# Patient Record
Sex: Male | Born: 1955 | Race: White | Hispanic: No | Marital: Married | Smoking: Former smoker
Health system: Southern US, Community
[De-identification: ages and names within clinical notes are randomized; demographics above are authoritative.]

## PROBLEM LIST (undated history)

## (undated) DIAGNOSIS — Z789 Other specified health status: Secondary | ICD-10-CM

## (undated) HISTORY — PX: COLONOSCOPY: SHX174

---

## 1990-07-05 HISTORY — PX: INGUINAL HERNIA REPAIR: SHX194

## 1999-02-21 ENCOUNTER — Emergency Department (HOSPITAL_COMMUNITY): Admission: EM | Admit: 1999-02-21 | Discharge: 1999-02-21 | Payer: Self-pay | Admitting: Emergency Medicine

## 2000-07-16 ENCOUNTER — Emergency Department (HOSPITAL_COMMUNITY): Admission: EM | Admit: 2000-07-16 | Discharge: 2000-07-16 | Payer: Self-pay | Admitting: Emergency Medicine

## 2009-09-28 ENCOUNTER — Emergency Department (HOSPITAL_COMMUNITY): Admission: EM | Admit: 2009-09-28 | Discharge: 2009-09-28 | Payer: Self-pay | Admitting: Emergency Medicine

## 2010-06-01 ENCOUNTER — Encounter: Admission: RE | Admit: 2010-06-01 | Discharge: 2010-06-01 | Payer: Self-pay | Admitting: Internal Medicine

## 2010-09-28 LAB — DIFFERENTIAL
Basophils Absolute: 0.1 10*3/uL (ref 0.0–0.1)
Basophils Relative: 1 % (ref 0–1)
Lymphocytes Relative: 16 % (ref 12–46)
Monocytes Absolute: 0.5 10*3/uL (ref 0.1–1.0)
Neutro Abs: 5.1 10*3/uL (ref 1.7–7.7)
Neutrophils Relative %: 73 % (ref 43–77)

## 2010-09-28 LAB — BASIC METABOLIC PANEL
CO2: 27 mEq/L (ref 19–32)
Calcium: 8.7 mg/dL (ref 8.4–10.5)
Creatinine, Ser: 1.08 mg/dL (ref 0.4–1.5)
GFR calc non Af Amer: >60 mL/min (ref >60)
Glucose, Bld: 111 mg/dL — ABNORMAL HIGH (ref 70–99)
Sodium: 134 mEq/L — ABNORMAL LOW (ref 135–145)

## 2010-09-28 LAB — CBC
Hemoglobin: 13.1 g/dL (ref 13.0–17.0)
MCHC: 33.4 g/dL (ref 30.0–36.0)
Platelets: 202 10*3/uL (ref 150–400)
RDW: 12.2 % (ref 11.5–15.5)

## 2010-09-28 LAB — URINALYSIS, ROUTINE W REFLEX MICROSCOPIC
Glucose, UA: NEGATIVE mg/dL
Ketones, ur: NEGATIVE mg/dL
Leukocytes, UA: NEGATIVE
Nitrite: NEGATIVE
Protein, ur: NEGATIVE mg/dL
pH: 6.5 (ref 5.0–8.0)

## 2010-09-28 LAB — URINE MICROSCOPIC-ADD ON

## 2013-07-25 ENCOUNTER — Other Ambulatory Visit: Payer: Self-pay | Admitting: Orthopedic Surgery

## 2013-07-25 DIAGNOSIS — M25562 Pain in left knee: Secondary | ICD-10-CM

## 2013-07-25 DIAGNOSIS — R609 Edema, unspecified: Secondary | ICD-10-CM

## 2013-07-29 ENCOUNTER — Ambulatory Visit
Admission: RE | Admit: 2013-07-29 | Discharge: 2013-07-29 | Disposition: A | Discharge status: Home / Self Care | Payer: 59 | Source: Ambulatory Visit | Attending: Orthopedic Surgery | Admitting: Orthopedic Surgery

## 2013-07-29 DIAGNOSIS — M25562 Pain in left knee: Secondary | ICD-10-CM

## 2013-07-29 DIAGNOSIS — R609 Edema, unspecified: Secondary | ICD-10-CM

## 2013-08-01 ENCOUNTER — Other Ambulatory Visit: Payer: Self-pay | Admitting: Orthopedic Surgery

## 2013-08-07 ENCOUNTER — Encounter (HOSPITAL_BASED_OUTPATIENT_CLINIC_OR_DEPARTMENT_OTHER): Payer: Self-pay | Admitting: *Deleted

## 2013-08-07 NOTE — Progress Notes (Signed)
Pt has no cardiac or resp problems-healthy  

## 2013-08-08 ENCOUNTER — Ambulatory Visit (HOSPITAL_BASED_OUTPATIENT_CLINIC_OR_DEPARTMENT_OTHER): Payer: 59 | Admitting: Anesthesiology

## 2013-08-08 ENCOUNTER — Encounter (HOSPITAL_BASED_OUTPATIENT_CLINIC_OR_DEPARTMENT_OTHER)
Admission: RE | Disposition: A | Discharge status: Home / Self Care | Payer: Self-pay | Source: Ambulatory Visit | Attending: Orthopedic Surgery

## 2013-08-08 ENCOUNTER — Encounter (HOSPITAL_BASED_OUTPATIENT_CLINIC_OR_DEPARTMENT_OTHER): Payer: 59 | Admitting: Anesthesiology

## 2013-08-08 ENCOUNTER — Ambulatory Visit (HOSPITAL_BASED_OUTPATIENT_CLINIC_OR_DEPARTMENT_OTHER)
Admission: RE | Admit: 2013-08-08 | Discharge: 2013-08-08 | Disposition: A | Discharge status: Home / Self Care | Payer: 59 | Source: Ambulatory Visit | Attending: Orthopedic Surgery | Admitting: Orthopedic Surgery

## 2013-08-08 ENCOUNTER — Encounter (HOSPITAL_BASED_OUTPATIENT_CLINIC_OR_DEPARTMENT_OTHER): Payer: Self-pay | Admitting: *Deleted

## 2013-08-08 DIAGNOSIS — K219 Gastro-esophageal reflux disease without esophagitis: Secondary | ICD-10-CM | POA: Insufficient documentation

## 2013-08-08 DIAGNOSIS — M23305 Other meniscus derangements, unspecified medial meniscus, unspecified knee: Secondary | ICD-10-CM | POA: Insufficient documentation

## 2013-08-08 DIAGNOSIS — M6750 Plica syndrome, unspecified knee: Secondary | ICD-10-CM | POA: Insufficient documentation

## 2013-08-08 DIAGNOSIS — Z87891 Personal history of nicotine dependence: Secondary | ICD-10-CM | POA: Insufficient documentation

## 2013-08-08 HISTORY — DX: Other specified health status: Z78.9

## 2013-08-08 HISTORY — PX: KNEE ARTHROSCOPY: SHX127

## 2013-08-08 SURGERY — ARTHROSCOPY, KNEE
Anesthesia: General | Site: Knee | Laterality: Left

## 2013-08-08 MED ORDER — DEXAMETHASONE SODIUM PHOSPHATE 4 MG/ML IJ SOLN
INTRAMUSCULAR | Status: DC | PRN
  Administered 2013-08-08: 10 mg via INTRAVENOUS

## 2013-08-08 MED ORDER — MIDAZOLAM HCL 2 MG/2ML IJ SOLN
1.0000 mg | INTRAMUSCULAR | Status: DC | PRN
  Administered 2013-08-08: 2 mg via INTRAVENOUS

## 2013-08-08 MED ORDER — LACTATED RINGERS IV SOLN
INTRAVENOUS | Status: DC
  Administered 2013-08-08: 13:00:00 via INTRAVENOUS

## 2013-08-08 MED ORDER — BUPIVACAINE-EPINEPHRINE PF 0.5-1:200000 % IJ SOLN
INTRAMUSCULAR | Status: AC
  Filled 2013-08-08: qty 30

## 2013-08-08 MED ORDER — FENTANYL CITRATE 0.05 MG/ML IJ SOLN
INTRAMUSCULAR | Status: DC | PRN
  Administered 2013-08-08: 100 ug via INTRAVENOUS

## 2013-08-08 MED ORDER — BUPIVACAINE-EPINEPHRINE PF 0.5-1:200000 % IJ SOLN: INTRAMUSCULAR | Status: DC | PRN

## 2013-08-08 MED ORDER — ONDANSETRON HCL 4 MG/2ML IJ SOLN
INTRAMUSCULAR | Status: DC | PRN
  Administered 2013-08-08: 4 mg via INTRAVENOUS

## 2013-08-08 MED ORDER — CHLORHEXIDINE GLUCONATE 4 % EX LIQD: 60.0000 mL | Freq: Once | CUTANEOUS | Status: DC

## 2013-08-08 MED ORDER — FENTANYL CITRATE 0.05 MG/ML IJ SOLN
50.0000 ug | INTRAMUSCULAR | Status: DC | PRN
  Administered 2013-08-08: 100 ug via INTRAVENOUS

## 2013-08-08 MED ORDER — FENTANYL CITRATE 0.05 MG/ML IJ SOLN
INTRAMUSCULAR | Status: AC
  Filled 2013-08-08: qty 2

## 2013-08-08 MED ORDER — SODIUM CHLORIDE 0.9 % IR SOLN
Status: DC | PRN
  Administered 2013-08-08: 6000 mL

## 2013-08-08 MED ORDER — MIDAZOLAM HCL 2 MG/2ML IJ SOLN
INTRAMUSCULAR | Status: AC
  Filled 2013-08-08: qty 2

## 2013-08-08 MED ORDER — PROPOFOL 10 MG/ML IV BOLUS
INTRAVENOUS | Status: AC
  Filled 2013-08-08: qty 20

## 2013-08-08 MED ORDER — BUPIVACAINE-EPINEPHRINE PF 0.5-1:200000 % IJ SOLN
INTRAMUSCULAR | Status: DC | PRN
  Administered 2013-08-08: 25 mL

## 2013-08-08 MED ORDER — ROPIVACAINE HCL 5 MG/ML IJ SOLN
INTRAMUSCULAR | Status: DC | PRN
  Administered 2013-08-08: 20 mL

## 2013-08-08 MED ORDER — FENTANYL CITRATE 0.05 MG/ML IJ SOLN
INTRAMUSCULAR | Status: AC
  Filled 2013-08-08: qty 4

## 2013-08-08 MED ORDER — PROPOFOL 10 MG/ML IV BOLUS
INTRAVENOUS | Status: DC | PRN
  Administered 2013-08-08: 200 mg via INTRAVENOUS

## 2013-08-08 MED ORDER — CEFAZOLIN SODIUM-DEXTROSE 2-3 GM-% IV SOLR
INTRAVENOUS | Status: AC
  Filled 2013-08-08: qty 50

## 2013-08-08 MED ORDER — LIDOCAINE HCL (CARDIAC) 20 MG/ML IV SOLN
INTRAVENOUS | Status: DC | PRN
  Administered 2013-08-08: 50 mg via INTRAVENOUS

## 2013-08-08 SURGICAL SUPPLY — 46 items
BANDAGE ELASTIC 6 VELCRO ST LF (GAUZE/BANDAGES/DRESSINGS) ×3 IMPLANT
BANDAGE ESMARK 6X9 LF (GAUZE/BANDAGES/DRESSINGS) IMPLANT
BLADE CUDA 5.5 (BLADE) IMPLANT
BLADE CUDA GRT WHITE 3.5 (BLADE) ×3 IMPLANT
BLADE CUDA SHAVER 3.5 (BLADE) IMPLANT
BLADE CUTTER GATOR 3.5 (BLADE) IMPLANT
BLADE MINI RND TIP GREEN BEAV (BLADE) IMPLANT
BNDG ESMARK 6X9 LF (GAUZE/BANDAGES/DRESSINGS)
BUR OVAL 6.0 (BURR) IMPLANT
CANISTER SUCT 3000ML (MISCELLANEOUS) IMPLANT
DRAPE ARTHROSCOPY W/POUCH 114 (DRAPES) ×3 IMPLANT
DRAPE U-SHAPE 47X51 STRL (DRAPES) ×3 IMPLANT
DURAPREP 26ML APPLICATOR (WOUND CARE) ×3 IMPLANT
ELECT MENISCUS 165MM 90D (ELECTRODE) IMPLANT
ELECT REM PT RETURN 9FT ADLT (ELECTROSURGICAL)
ELECTRODE REM PT RTRN 9FT ADLT (ELECTROSURGICAL) IMPLANT
GAUZE XEROFORM 1X8 LF (GAUZE/BANDAGES/DRESSINGS) ×3 IMPLANT
GLOVE BIO SURGEON STRL SZ 6.5 (GLOVE) ×4 IMPLANT
GLOVE BIO SURGEON STRL SZ7.5 (GLOVE) ×3 IMPLANT
GLOVE BIO SURGEONS STRL SZ 6.5 (GLOVE) ×2
GLOVE BIOGEL PI IND STRL 7.0 (GLOVE) ×2 IMPLANT
GLOVE BIOGEL PI IND STRL 8 (GLOVE) ×1 IMPLANT
GLOVE BIOGEL PI IND STRL 8.5 (GLOVE) ×2 IMPLANT
GLOVE BIOGEL PI INDICATOR 7.0 (GLOVE) ×4
GLOVE BIOGEL PI INDICATOR 8 (GLOVE) ×2
GLOVE BIOGEL PI INDICATOR 8.5 (GLOVE) ×4
GLOVE SURG ORTHO 8.0 STRL STRW (GLOVE) ×3 IMPLANT
GOWN STRL REUS W/ TWL LRG LVL3 (GOWN DISPOSABLE) ×3 IMPLANT
GOWN STRL REUS W/ TWL XL LVL3 (GOWN DISPOSABLE) IMPLANT
GOWN STRL REUS W/TWL LRG LVL3 (GOWN DISPOSABLE) ×6
GOWN STRL REUS W/TWL XL LVL3 (GOWN DISPOSABLE)
KNEE WRAP E Z 3 GEL PACK (MISCELLANEOUS) ×3 IMPLANT
MANIFOLD NEPTUNE II (INSTRUMENTS) IMPLANT
PACK ARTHROSCOPY DSU (CUSTOM PROCEDURE TRAY) ×3 IMPLANT
PACK BASIN DAY SURGERY FS (CUSTOM PROCEDURE TRAY) ×3 IMPLANT
PADDING CAST COTTON 6X4 STRL (CAST SUPPLIES) ×3 IMPLANT
PENCIL BUTTON HOLSTER BLD 10FT (ELECTRODE) IMPLANT
SET ARTHROSCOPY TUBING (MISCELLANEOUS) ×2
SET ARTHROSCOPY TUBING LN (MISCELLANEOUS) ×1 IMPLANT
SPONGE GAUZE 4X4 12PLY (GAUZE/BANDAGES/DRESSINGS) ×3 IMPLANT
SUT ETHILON 4 0 PS 2 18 (SUTURE) ×3 IMPLANT
TOWEL OR 17X24 6PK STRL BLUE (TOWEL DISPOSABLE) ×3 IMPLANT
WAND 3.0 CAPSURE 30 DEG W/CORD (SURGICAL WAND) IMPLANT
WAND 30 DEG SABER W/CORD (SURGICAL WAND) IMPLANT
WAND STAR VAC 90 (SURGICAL WAND) IMPLANT
WATER STERILE IRR 1000ML POUR (IV SOLUTION) ×3 IMPLANT

## 2013-08-08 NOTE — Anesthesia Preprocedure Evaluation (Addendum)
Anesthesia Evaluation  Patient identified by MRN, date of birth, ID band Patient awake    Reviewed: Allergy & Precautions, H&P , NPO status , Patient's Chart, lab work & pertinent test results  Airway Mallampati: II TM Distance: >3 FB Neck ROM: Full    Dental no notable dental hx. (+) Teeth Intact and Dental Advisory Given   Pulmonary neg pulmonary ROS, former smoker,  breath sounds clear to auscultation  Pulmonary exam normal       Cardiovascular negative cardio ROS  Rhythm:Regular Rate:Normal     Neuro/Psych negative neurological ROS  negative psych ROS   GI/Hepatic Neg liver ROS, GERD-  Medicated and Controlled,  Endo/Other  negative endocrine ROS  Renal/GU negative Renal ROS  negative genitourinary   Musculoskeletal   Abdominal   Peds  Hematology negative hematology ROS (+)   Anesthesia Other Findings   Reproductive/Obstetrics negative OB ROS                          Anesthesia Physical Anesthesia Plan  ASA: II  Anesthesia Plan: General   Post-op Pain Management:    Induction: Intravenous  Airway Management Planned: LMA  Additional Equipment:   Intra-op Plan:   Post-operative Plan: Extubation in OR  Informed Consent: I have reviewed the patients History and Physical, chart, labs and discussed the procedure including the risks, benefits and alternatives for the proposed anesthesia with the patient or authorized representative who has indicated his/her understanding and acceptance.   Dental advisory given  Plan Discussed with: CRNA and Surgeon  Anesthesia Plan Comments:        Anesthesia Quick Evaluation

## 2013-08-08 NOTE — Progress Notes (Signed)
Assisted Dr. Fitzgerald with left, knee block. Side rails up, monitors on throughout procedure. See vital signs in flow sheet. Tolerated Procedure well. 

## 2013-08-08 NOTE — Transfer of Care (Signed)
Immediate Anesthesia Transfer of Care Note  Patient: Alan Cortez  Procedure(s) Performed: Procedure(s): ARTHROSCOPY KNEE PARTIAL MEDIAL MENISECTOMY, AND PLICA  RESECTION (Left)  Patient Location: PACU  Anesthesia Type:General and Regional  Level of Consciousness: sedated  Airway & Oxygen Therapy: Patient Spontanous Breathing and Patient connected to face mask oxygen  Post-op Assessment: Report given to PACU RN and Post -op Vital signs reviewed and stable  Post vital signs: Reviewed and stable  Complications: No apparent anesthesia complications

## 2013-08-08 NOTE — Anesthesia Procedure Notes (Signed)
Procedure Name: LMA Insertion Date/Time: 08/08/2013 1:31 PM Performed by: Caren MacadamARTER, Kalley Nicholl W Pre-anesthesia Checklist: Patient identified, Emergency Drugs available, Suction available and Patient being monitored Patient Re-evaluated:Patient Re-evaluated prior to inductionOxygen Delivery Method: Circle System Utilized Preoxygenation: Pre-oxygenation with 100% oxygen Intubation Type: IV induction Ventilation: Mask ventilation without difficulty LMA: LMA inserted LMA Size: 5.0 Number of attempts: 1 Airway Equipment and Method: bite block Placement Confirmation: positive ETCO2 and breath sounds checked- equal and bilateral Tube secured with: Tape Dental Injury: Teeth and Oropharynx as per pre-operative assessment

## 2013-08-08 NOTE — Anesthesia Postprocedure Evaluation (Signed)
  Anesthesia Post-op Note  Patient: Alan CurioJeffrey W Cortez  Procedure(s) Performed: Procedure(s): ARTHROSCOPY KNEE PARTIAL MEDIAL MENISECTOMY, AND PLICA  RESECTION (Left)  Patient Location: PACU  Anesthesia Type:GA combined with regional for post-op pain  Level of Consciousness: awake and alert   Airway and Oxygen Therapy: Patient Spontanous Breathing  Post-op Pain: mild  Post-op Assessment: Post-op Vital signs reviewed  Post-op Vital Signs: stable  Complications: No apparent anesthesia complications

## 2013-08-08 NOTE — H&P (View-Only) (Signed)
Pt has no cardiac or resp problems-healthy

## 2013-08-08 NOTE — Discharge Instructions (Signed)
°  Post Anesthesia Home Care Instructions ° °Activity: °Get plenty of rest for the remainder of the day. A responsible adult should stay with you for 24 hours following the procedure.  °For the next 24 hours, DO NOT: °-Drive a car °-Operate machinery °-Drink alcoholic beverages °-Take any medication unless instructed by your physician °-Make any legal decisions or sign important papers. ° °Meals: °Start with liquid foods such as gelatin or soup. Progress to regular foods as tolerated. Avoid greasy, spicy, heavy foods. If nausea and/or vomiting occur, drink only clear liquids until the nausea and/or vomiting subsides. Call your physician if vomiting continues. ° °Special Instructions/Symptoms: °Your throat may feel dry or sore from the anesthesia or the breathing tube placed in your throat during surgery. If this causes discomfort, gargle with warm salt water. The discomfort should disappear within 24 hours. ° °Regional Anesthesia Blocks ° °1. Numbness or the inability to move the "blocked" extremity may last from 3-48 hours after placement. The length of time depends on the medication injected and your individual response to the medication. If the numbness is not going away after 48 hours, call your surgeon. ° °2. The extremity that is blocked will need to be protected until the numbness is gone and the  Strength has returned. Because you cannot feel it, you will need to take extra care to avoid injury. Because it may be weak, you may have difficulty moving it or using it. You may not know what position it is in without looking at it while the block is in effect. ° °3. For blocks in the legs and feet, returning to weight bearing and walking needs to be done carefully. You will need to wait until the numbness is entirely gone and the strength has returned. You should be able to move your leg and foot normally before you try and bear weight or walk. You will need someone to be with you when you first try to ensure you  do not fall and possibly risk injury. ° °4. Bruising and tenderness at the needle site are common side effects and will resolve in a few days. ° °5. Persistent numbness or new problems with movement should be communicated to the surgeon or the [facility] ([phone])/ [facility] ([phone]). °

## 2013-08-08 NOTE — Interval H&P Note (Signed)
History and Physical Interval Note:  08/08/2013 1:32 PM  Alan CurioJeffrey W Gorelik  has presented today for surgery, with the diagnosis of medial meniscus tear left knee  The various methods of treatment have been discussed with the patient and family. After consideration of risks, benefits and other options for treatment, the patient has consented to  Procedure(s): ARTHROSCOPY KNEE (Left) as a surgical intervention .  The patient's history has been reviewed, patient examined, no change in status, stable for surgery.  I have reviewed the patient's chart and labs.  Questions were answered to the patient's satisfaction.     Joscelin Fray,STEPHEN D

## 2013-08-08 NOTE — H&P (Signed)
  Alan CurioJeffrey W Cortez MRN:  [id number] DOB/SEX:  December 30, 1955/male  CHIEF COMPLAINT:  Painful left Knee  HISTORY: Patient is a 58 y.o. male presented with a history of pain in the left knee. Onset of symptoms was abrupt starting several weeks ago with gradually worsening course since that time. Prior procedures on the knee include none. Patient has been treated conservatively with over-the-counter NSAIDs and activity modification. Patient currently rates pain in the knee at 7 out of 10 with activity. There is no pain at night.  PAST MEDICAL HISTORY: There are no active problems to display for this patient.  Past Medical History  Diagnosis Date  . Medical history non-contributory    Past Surgical History  Procedure Laterality Date  . Inguinal hernia repair  1992    left  . Colonoscopy       MEDICATIONS:   No prescriptions prior to admission    ALLERGIES:  No Known Allergies  REVIEW OF SYSTEMS:  Pertinent items are noted in HPI.   FAMILY HISTORY:  History reviewed. No pertinent family history.  SOCIAL HISTORY:   History  Substance Use Topics  . Smoking status: Former Smoker    Quit date: 08/07/1978  . Smokeless tobacco: Not on file  . Alcohol Use: Yes     Comment: social     EXAMINATION:  Vital signs in last 24 hours: Weight:  [83.915 kg (185 lb)] 83.915 kg (185 lb) (02/03 0957)  General appearance: alert, cooperative and no distress Lungs: clear to auscultation bilaterally Heart: regular rate and rhythm, S1, S2 normal, no murmur, click, rub or gallop Abdomen: soft, non-tender; bowel sounds normal; no masses,  no organomegaly Extremities: extremities normal, atraumatic, no cyanosis or edema and Homans sign is negative, no sign of DVT Pulses: 2+ and symmetric Skin: Skin color, texture, turgor normal. No rashes or lesions Neurologic: Alert and oriented X 3, normal strength and tone. Normal symmetric reflexes. 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 The overall alignment is neutral. The bone quality appears to be good for age and reported activity level.  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 The patient has failed conservative treatment.  The clearance notes were reviewed.  After discussion with the patient it was felt that knee debridement was indicated. The procedure,  risks, and benefits of total knee arthroplasty were presented and reviewed.  The patient acknowledged the explanation, agreed to proceed with the plan.  Alan Cortez 08/08/2013, 7:39 AM

## 2013-08-09 ENCOUNTER — Encounter (HOSPITAL_BASED_OUTPATIENT_CLINIC_OR_DEPARTMENT_OTHER): Payer: Self-pay | Admitting: Orthopedic Surgery

## 2013-08-09 NOTE — Op Note (Signed)
Dictation Number: [id number]

## 2013-08-10 NOTE — Op Note (Signed)
NAMDelorse Lek:  Ohanian, JEFFREY               ACCOUNT NO.:  [id number]  MEDICAL RECORD NO.:  [id number]  LOCATION:                               FACILITY:  [facility]  PHYSICIAN:  Mila HomerStephen D. Sherlean FootLucey, M.D.      DATE OF BIRTH:  DATE OF PROCEDURE:  08/08/2013 DATE OF DISCHARGE:  08/08/2013                              OPERATIVE REPORT   SURGEON:  Mila HomerStephen D. Sherlean FootLucey, M.D.  ASSISTANT:  None.  ANESTHESIA:  General.  PREOPERATIVE DIAGNOSIS:  Left knee medial meniscus tear.  PREOPERATIVE DIAGNOSES:  Left knee medial meniscus tear.  Plica Syndrome.  PROCEDURES:  Left knee arthroscopy, partial medial meniscectomy, and plica resection.  DESCRIPTION OF PROCEDURE:  The patient was laid supine, administered general anesthesia.  Left leg was prepped and draped in usual sterile fashion.  Inferolateral and inferomedial portals were created with an #11 blade, blunt trocar, and cannula.  Diagnostic arthroscopy revealed minimal chondromalacia in the knee at all.  There was a large medial plica, which was resected.  I went into the medial compartment and very complex tear of the medial meniscus.  Straight upbiting basket forceps and shaver were used to perform partial medial meniscectomy.  Once that was done, I went into the notch.  ACL and PCL were normal.  I went into the lateral compartment, normal as well.  I then lavaged and closed with 4-0 nylon suture.  Dressed with Xeroform, dressing sponges, sterile Webril, and Ace wrap.  COMPLICATIONS:  None.  DRAINS:  None.          ______________________________ Mila HomerStephen D. Sherlean FootLucey, M.D.     SDL/MEDQ  D:  08/09/2013  T:  08/10/2013  Job:  [id number]

## 2015-10-28 ENCOUNTER — Ambulatory Visit: Payer: Self-pay | Admitting: General Surgery

## 2015-11-03 ENCOUNTER — Ambulatory Visit: Payer: Commercial Managed Care - HMO

## 2015-11-03 ENCOUNTER — Ambulatory Visit
Admission: RE | Admit: 2015-11-03 | Discharge: 2015-11-03 | Disposition: A | Discharge status: Home / Self Care | Payer: Commercial Managed Care - HMO | Source: Ambulatory Visit | Attending: General Surgery | Admitting: General Surgery

## 2015-11-03 ENCOUNTER — Other Ambulatory Visit: Payer: Self-pay | Admitting: General Surgery

## 2015-11-03 ENCOUNTER — Encounter (HOSPITAL_BASED_OUTPATIENT_CLINIC_OR_DEPARTMENT_OTHER)
Admission: RE | Admit: 2015-11-03 | Discharge: 2015-11-03 | Disposition: A | Discharge status: Home / Self Care | Payer: Self-pay | Source: Ambulatory Visit | Attending: General Surgery | Admitting: General Surgery

## 2015-11-03 ENCOUNTER — Encounter (HOSPITAL_BASED_OUTPATIENT_CLINIC_OR_DEPARTMENT_OTHER): Payer: Self-pay | Admitting: *Deleted

## 2015-11-03 ENCOUNTER — Other Ambulatory Visit: Payer: Self-pay

## 2015-11-03 DIAGNOSIS — K4090 Unilateral inguinal hernia, without obstruction or gangrene, not specified as recurrent: Secondary | ICD-10-CM | POA: Diagnosis not present

## 2015-11-03 DIAGNOSIS — Z01818 Encounter for other preprocedural examination: Secondary | ICD-10-CM

## 2015-11-03 DIAGNOSIS — Z87891 Personal history of nicotine dependence: Secondary | ICD-10-CM | POA: Diagnosis not present

## 2015-11-03 LAB — CBC WITH DIFFERENTIAL/PLATELET
BASOS ABS: 0 10*3/uL (ref 0.0–0.1)
Basophils Relative: 0 %
EOS PCT: 2 %
Eosinophils Absolute: 0.1 10*3/uL (ref 0.0–0.7)
HEMATOCRIT: 40.5 % (ref 39.0–52.0)
Hemoglobin: 12.9 g/dL — ABNORMAL LOW (ref 13.0–17.0)
LYMPHS ABS: 1.6 10*3/uL (ref 0.7–4.0)
LYMPHS PCT: 28 %
MCH: 27 pg (ref 26.0–34.0)
MCHC: 31.9 g/dL (ref 30.0–36.0)
MCV: 84.9 fL (ref 78.0–100.0)
MONO ABS: 0.5 10*3/uL (ref 0.1–1.0)
MONOS PCT: 9 %
NEUTROS ABS: 3.4 10*3/uL (ref 1.7–7.7)
Neutrophils Relative %: 61 %
Platelets: 225 10*3/uL (ref 150–400)
RBC: 4.77 MIL/uL (ref 4.22–5.81)
RDW: 13 % (ref 11.5–15.5)
WBC: 5.7 10*3/uL (ref 4.0–10.5)

## 2015-11-03 LAB — BASIC METABOLIC PANEL
ANION GAP: 9 (ref 5–15)
BUN: 12 mg/dL (ref 6–20)
CHLORIDE: 105 mmol/L (ref 101–111)
CO2: 26 mmol/L (ref 22–32)
Calcium: 8.9 mg/dL (ref 8.9–10.3)
Creatinine, Ser: 1.09 mg/dL (ref 0.61–1.24)
GFR calc Af Amer: >60 mL/min (ref >60)
GLUCOSE: 99 mg/dL (ref 65–99)
POTASSIUM: 3.9 mmol/L (ref 3.5–5.1)
Sodium: 140 mmol/L (ref 135–145)

## 2015-11-07 ENCOUNTER — Ambulatory Visit (HOSPITAL_BASED_OUTPATIENT_CLINIC_OR_DEPARTMENT_OTHER)
Admission: RE | Admit: 2015-11-07 | Discharge: 2015-11-07 | Disposition: A | Discharge status: Home / Self Care | Payer: Commercial Managed Care - HMO | Source: Ambulatory Visit | Attending: General Surgery | Admitting: General Surgery

## 2015-11-07 ENCOUNTER — Encounter (HOSPITAL_BASED_OUTPATIENT_CLINIC_OR_DEPARTMENT_OTHER)
Admission: RE | Disposition: A | Discharge status: Home / Self Care | Payer: Self-pay | Source: Ambulatory Visit | Attending: General Surgery

## 2015-11-07 ENCOUNTER — Encounter (HOSPITAL_BASED_OUTPATIENT_CLINIC_OR_DEPARTMENT_OTHER): Payer: Self-pay | Admitting: Certified Registered"

## 2015-11-07 ENCOUNTER — Ambulatory Visit (HOSPITAL_BASED_OUTPATIENT_CLINIC_OR_DEPARTMENT_OTHER): Payer: Commercial Managed Care - HMO | Admitting: Anesthesiology

## 2015-11-07 DIAGNOSIS — K4090 Unilateral inguinal hernia, without obstruction or gangrene, not specified as recurrent: Secondary | ICD-10-CM | POA: Insufficient documentation

## 2015-11-07 DIAGNOSIS — Z87891 Personal history of nicotine dependence: Secondary | ICD-10-CM | POA: Insufficient documentation

## 2015-11-07 DIAGNOSIS — Z01818 Encounter for other preprocedural examination: Secondary | ICD-10-CM

## 2015-11-07 HISTORY — PX: INGUINAL HERNIA REPAIR: SHX194

## 2015-11-07 HISTORY — PX: INSERTION OF MESH: SHX5868

## 2015-11-07 SURGERY — REPAIR, HERNIA, INGUINAL, ADULT
Anesthesia: General | Site: Groin | Laterality: Right

## 2015-11-07 MED ORDER — ONDANSETRON HCL 4 MG/2ML IJ SOLN
INTRAMUSCULAR | Status: DC | PRN
  Administered 2015-11-07: 4 mg via INTRAVENOUS

## 2015-11-07 MED ORDER — DEXAMETHASONE SODIUM PHOSPHATE 4 MG/ML IJ SOLN
INTRAMUSCULAR | Status: DC | PRN
  Administered 2015-11-07: 10 mg via INTRAVENOUS

## 2015-11-07 MED ORDER — HYDROCODONE-ACETAMINOPHEN 5-325 MG PO TABS: 1.0000 | ORAL_TABLET | ORAL | Status: DC | PRN

## 2015-11-07 MED ORDER — PROPOFOL 500 MG/50ML IV EMUL
INTRAVENOUS | Status: AC
  Filled 2015-11-07: qty 50

## 2015-11-07 MED ORDER — MEPERIDINE HCL 25 MG/ML IJ SOLN: 6.2500 mg | INTRAMUSCULAR | Status: DC | PRN

## 2015-11-07 MED ORDER — BUPIVACAINE HCL (PF) 0.5 % IJ SOLN
INTRAMUSCULAR | Status: DC | PRN
  Administered 2015-11-07: 10 mL

## 2015-11-07 MED ORDER — SODIUM CHLORIDE 0.9 % IR SOLN
Status: DC | PRN
  Administered 2015-11-07: 100 mL

## 2015-11-07 MED ORDER — MIDAZOLAM HCL 2 MG/2ML IJ SOLN
1.0000 mg | INTRAMUSCULAR | Status: DC | PRN
  Administered 2015-11-07: 2 mg via INTRAVENOUS
  Administered 2015-11-07 (×2): 1 mg via INTRAVENOUS

## 2015-11-07 MED ORDER — CEFAZOLIN SODIUM-DEXTROSE 2-4 GM/100ML-% IV SOLN
2.0000 g | INTRAVENOUS | Status: AC
  Administered 2015-11-07: 2 g via INTRAVENOUS

## 2015-11-07 MED ORDER — LIDOCAINE HCL (CARDIAC) 20 MG/ML IV SOLN
INTRAVENOUS | Status: DC | PRN
  Administered 2015-11-07: 20 mg via INTRAVENOUS

## 2015-11-07 MED ORDER — OXYCODONE HCL 5 MG PO TABS: 5.0000 mg | ORAL_TABLET | Freq: Once | ORAL | Status: DC | PRN

## 2015-11-07 MED ORDER — HYDROMORPHONE HCL 1 MG/ML IJ SOLN
INTRAMUSCULAR | Status: AC
  Filled 2015-11-07: qty 1

## 2015-11-07 MED ORDER — LIDOCAINE HCL (PF) 1 % IJ SOLN
INTRAMUSCULAR | Status: AC
  Filled 2015-11-07: qty 30

## 2015-11-07 MED ORDER — CHLORHEXIDINE GLUCONATE 4 % EX LIQD: 1.0000 "application " | Freq: Once | CUTANEOUS | Status: DC

## 2015-11-07 MED ORDER — FENTANYL CITRATE (PF) 100 MCG/2ML IJ SOLN
INTRAMUSCULAR | Status: AC
  Filled 2015-11-07: qty 2

## 2015-11-07 MED ORDER — ONDANSETRON HCL 4 MG/2ML IJ SOLN
INTRAMUSCULAR | Status: AC
  Filled 2015-11-07: qty 2

## 2015-11-07 MED ORDER — SODIUM BICARBONATE 4 % IV SOLN
INTRAVENOUS | Status: AC
  Filled 2015-11-07: qty 5

## 2015-11-07 MED ORDER — DEXAMETHASONE SODIUM PHOSPHATE 10 MG/ML IJ SOLN
INTRAMUSCULAR | Status: AC
  Filled 2015-11-07: qty 1

## 2015-11-07 MED ORDER — PROPOFOL 10 MG/ML IV BOLUS
INTRAVENOUS | Status: DC | PRN
Start: 2015-11-07 — End: 2015-11-07
  Administered 2015-11-07: 200 mg via INTRAVENOUS

## 2015-11-07 MED ORDER — SCOPOLAMINE 1 MG/3DAYS TD PT72: 1.0000 | MEDICATED_PATCH | Freq: Once | TRANSDERMAL | Status: DC | PRN

## 2015-11-07 MED ORDER — FENTANYL CITRATE (PF) 100 MCG/2ML IJ SOLN
50.0000 ug | INTRAMUSCULAR | Status: DC | PRN
Start: 2015-11-07 — End: 2015-11-07
  Administered 2015-11-07: 25 ug via INTRAVENOUS
  Administered 2015-11-07: 100 ug via INTRAVENOUS

## 2015-11-07 MED ORDER — CEFAZOLIN SODIUM-DEXTROSE 2-4 GM/100ML-% IV SOLN
INTRAVENOUS | Status: AC
Start: 2015-11-07 — End: 2015-11-07
  Filled 2015-11-07: qty 100

## 2015-11-07 MED ORDER — LIDOCAINE 2% (20 MG/ML) 5 ML SYRINGE
INTRAMUSCULAR | Status: AC
  Filled 2015-11-07: qty 5

## 2015-11-07 MED ORDER — GLYCOPYRROLATE 0.2 MG/ML IJ SOLN: 0.2000 mg | Freq: Once | INTRAMUSCULAR | Status: DC | PRN

## 2015-11-07 MED ORDER — LACTATED RINGERS IV SOLN
INTRAVENOUS | Status: DC
  Administered 2015-11-07 (×2): via INTRAVENOUS

## 2015-11-07 MED ORDER — ROCURONIUM BROMIDE 50 MG/5ML IV SOLN
INTRAVENOUS | Status: AC
  Filled 2015-11-07: qty 1

## 2015-11-07 MED ORDER — OXYCODONE HCL 5 MG/5ML PO SOLN: 5.0000 mg | Freq: Once | ORAL | Status: DC | PRN

## 2015-11-07 MED ORDER — MIDAZOLAM HCL 2 MG/2ML IJ SOLN
INTRAMUSCULAR | Status: AC
  Filled 2015-11-07: qty 2

## 2015-11-07 MED ORDER — HYDROMORPHONE HCL 1 MG/ML IJ SOLN
0.2500 mg | INTRAMUSCULAR | Status: DC | PRN
  Administered 2015-11-07 (×2): 0.5 mg via INTRAVENOUS

## 2015-11-07 MED ORDER — BUPIVACAINE HCL (PF) 0.5 % IJ SOLN
INTRAMUSCULAR | Status: AC
  Filled 2015-11-07: qty 30

## 2015-11-07 SURGICAL SUPPLY — 57 items
BAG DECANTER FOR FLEXI CONT (MISCELLANEOUS) ×3 IMPLANT
BLADE CLIPPER SURG (BLADE) ×3 IMPLANT
BLADE SURG 10 STRL SS (BLADE) ×3 IMPLANT
BLADE SURG 15 STRL LF DISP TIS (BLADE) ×2 IMPLANT
BLADE SURG 15 STRL SS (BLADE) ×4
CANISTER SUCT 1200ML W/VALVE (MISCELLANEOUS) IMPLANT
CHLORAPREP W/TINT 26ML (MISCELLANEOUS) ×3 IMPLANT
CLEANER CAUTERY TIP 5X5 PAD (MISCELLANEOUS) ×1 IMPLANT
CLOSURE WOUND 1/2 X4 (GAUZE/BANDAGES/DRESSINGS) ×1
COVER BACK TABLE 60X90IN (DRAPES) ×3 IMPLANT
COVER MAYO STAND STRL (DRAPES) ×3 IMPLANT
DECANTER SPIKE VIAL GLASS SM (MISCELLANEOUS) ×3 IMPLANT
DRAIN PENROSE 1/2X12 LTX STRL (WOUND CARE) ×3 IMPLANT
DRAPE LAPAROTOMY TRNSV 102X78 (DRAPE) ×3 IMPLANT
DRAPE UTILITY XL STRL (DRAPES) ×3 IMPLANT
DRSG TEGADERM 2-3/8X2-3/4 SM (GAUZE/BANDAGES/DRESSINGS) IMPLANT
DRSG TEGADERM 4X4.75 (GAUZE/BANDAGES/DRESSINGS) ×3 IMPLANT
ELECT REM PT RETURN 9FT ADLT (ELECTROSURGICAL) ×3
ELECTRODE REM PT RTRN 9FT ADLT (ELECTROSURGICAL) ×1 IMPLANT
GLOVE BIOGEL PI IND STRL 7.0 (GLOVE) ×1 IMPLANT
GLOVE BIOGEL PI IND STRL 7.5 (GLOVE) ×1 IMPLANT
GLOVE BIOGEL PI IND STRL 8 (GLOVE) ×1 IMPLANT
GLOVE BIOGEL PI INDICATOR 7.0 (GLOVE) ×2
GLOVE BIOGEL PI INDICATOR 7.5 (GLOVE) ×2
GLOVE BIOGEL PI INDICATOR 8 (GLOVE) ×2
GLOVE ECLIPSE 6.5 STRL STRAW (GLOVE) ×3 IMPLANT
GLOVE ECLIPSE 7.5 STRL STRAW (GLOVE) ×3 IMPLANT
GOWN STRL REUS W/ TWL LRG LVL3 (GOWN DISPOSABLE) ×1 IMPLANT
GOWN STRL REUS W/ TWL XL LVL3 (GOWN DISPOSABLE) ×1 IMPLANT
GOWN STRL REUS W/TWL LRG LVL3 (GOWN DISPOSABLE) ×2
GOWN STRL REUS W/TWL XL LVL3 (GOWN DISPOSABLE) ×2
LIQUID BAND (GAUZE/BANDAGES/DRESSINGS) ×3 IMPLANT
MESH HERNIA 3X6 (Mesh General) ×3 IMPLANT
NEEDLE HYPO 25X1 1.5 SAFETY (NEEDLE) ×3 IMPLANT
NS IRRIG 1000ML POUR BTL (IV SOLUTION) IMPLANT
PACK BASIN DAY SURGERY FS (CUSTOM PROCEDURE TRAY) ×3 IMPLANT
PAD CLEANER CAUTERY TIP 5X5 (MISCELLANEOUS) ×2
PENCIL BUTTON HOLSTER BLD 10FT (ELECTRODE) ×3 IMPLANT
SLEEVE SCD COMPRESS KNEE MED (MISCELLANEOUS) ×3 IMPLANT
SPONGE INTESTINAL PEANUT (DISPOSABLE) ×3 IMPLANT
SPONGE LAP 4X18 X RAY DECT (DISPOSABLE) ×3 IMPLANT
STRIP CLOSURE SKIN 1/2X4 (GAUZE/BANDAGES/DRESSINGS) ×2 IMPLANT
SUT ETHIBOND 0 MO6 C/R (SUTURE) ×3 IMPLANT
SUT MON AB 4-0 PC3 18 (SUTURE) ×3 IMPLANT
SUT PROLENE 0 CT 2 (SUTURE) ×6 IMPLANT
SUT PROLENE 2 0 CT2 30 (SUTURE) IMPLANT
SUT VIC AB 3-0 SH 27 (SUTURE) ×4
SUT VIC AB 3-0 SH 27X BRD (SUTURE) ×2 IMPLANT
SUT VICRYL 4-0 PS2 18IN ABS (SUTURE) IMPLANT
SUT VICRYL AB 3 0 TIES (SUTURE) IMPLANT
SYR BULB 3OZ (MISCELLANEOUS) ×3 IMPLANT
SYR CONTROL 10ML LL (SYRINGE) ×3 IMPLANT
TOWEL OR 17X24 6PK STRL BLUE (TOWEL DISPOSABLE) ×3 IMPLANT
TOWEL OR NON WOVEN STRL DISP B (DISPOSABLE) IMPLANT
TUBE CONNECTING 20'X1/4 (TUBING)
TUBE CONNECTING 20X1/4 (TUBING) IMPLANT
YANKAUER SUCT BULB TIP NO VENT (SUCTIONS) IMPLANT

## 2015-11-07 NOTE — H&P (Signed)
Alan Cortez. Rasp 10/28/2015 11:04 AM Location: [facility] Patient #: [id number] DOB: [birth year] Married / Language: [place] / Race: White Male   History of Present Illness Alan Cortez CMA; 10/28/2015 11:05 AM) Patient words: RIH.  The patient is a 60 year old male.   Other Problems Alan Cortez, CMA; 10/28/2015 11:04 AM) Gastroesophageal Reflux Disease Kidney Alan Cortez  Past Surgical History Alan Cortez, CMA; 10/28/2015 11:04 AM) Laparoscopic Inguinal Hernia Surgery Left.  Diagnostic Studies History Alan Cortez, CMA; 10/28/2015 11:04 AM) Colonoscopy 1-5 years ago  Allergies Alan Cortez, CMA; 10/28/2015 11:05 AM) No Known Drug Allergies04/25/2017  Medication History (Alan Cortez, CMA; 10/28/2015 11:06 AM) Advil (  Tablet Chewable, Oral as needed) Active. Claritin (  Capsule, Oral as needed) Active. Omeprazole (  Capsule DR, Oral) Active. Medications Reconciled  Social History Alan Cortez, CMA; 10/28/2015 11:04 AM) Alcohol use Occasional alcohol use. Caffeine use Coffee.  Family History Alan Cortez, CMA; 10/28/2015 11:04 AM) Alcohol Abuse Father. Cerebrovascular Accident Father.    Review of Systems Alan Cortez CMA; 10/28/2015 11:04 AM) General Not Present- Appetite Loss, Chills, Fatigue, Fever, Night Sweats, Weight Gain and Weight Loss. Skin Not Present- Change in Wart/Mole, Dryness, Hives, Jaundice, New Lesions, Non-Healing Wounds, Rash and Ulcer. HEENT Present- Ringing in the Ears, Seasonal Allergies and Wears glasses/contact lenses. Not Present- Earache, Hearing Loss, Hoarseness, Nose Bleed, Oral Ulcers, Sinus Pain, Sore Throat, Visual Disturbances and Yellow Eyes. Respiratory Not Present- Bloody sputum, Chronic Cough, Difficulty Breathing, Snoring and Wheezing. Breast Not Present- Breast Mass, Breast Pain, Nipple Discharge and Skin Changes. Cardiovascular Not Present- Chest Pain, Difficulty Breathing Lying Down, Leg Cramps, Palpitations,  Rapid Heart Rate, Shortness of Breath and Swelling of Extremities. Gastrointestinal Not Present- Abdominal Pain, Bloating, Bloody Stool, Change in Bowel Habits, Chronic diarrhea, Constipation, Difficulty Swallowing, Excessive gas, Gets full quickly at meals, Hemorrhoids, Indigestion, Nausea, Rectal Pain and Vomiting. Male Genitourinary Not Present- Blood in Urine, Change in Urinary Stream, Frequency, Impotence, Nocturia, Painful Urination, Urgency and Urine Leakage. Musculoskeletal Not Present- Back Pain, Joint Pain, Joint Stiffness, Muscle Pain, Muscle Weakness and Swelling of Extremities. Neurological Not Present- Decreased Memory, Fainting, Headaches, Numbness, Seizures, Tingling, Tremor, Trouble walking and Weakness. Psychiatric Not Present- Anxiety, Bipolar, Change in Sleep Pattern, Depression, Fearful and Frequent crying. Endocrine Not Present- Cold Intolerance, Excessive Hunger, Hair Changes, Heat Intolerance, Hot flashes and New Diabetes. Hematology Not Present- Easy Bruising, Excessive bleeding, Gland problems, HIV and Persistent Infections.  Vitals (Alan Cortez CMA; 10/28/2015 11:05 AM) 10/28/2015 11:04 AM Weight: 190 lb Height: 68in Body Surface Area: 2 m Body Mass Index: 28.89 kg/m  Temp.: 98.52F(Temporal)  Pulse: 68 (Regular)  BP: 124/78 (Sitting, Left Arm, Standard) Vitals  BP 128/77   P 54 Physical Exam (Alan Cortez O. Lindie Spruce MD; 10/28/2015 11:38 AM) General General Appearance-Well groomed. Orientation-Oriented X4. Build & Nutrition-Muscular. Posture-Normal posture.  Chest and Lung Exam Chest and lung exam reveals -normal excursion with symmetric chest walls and quiet, even and easy respiratory effort with no use of accessory muscles.  Cardiovascular Cardiovascular examination reveals -on palpation PMI is normal in location and amplitude, no palpable S3 or S4. Normal cardiac borders. and normal heart sounds, regular rate and rhythm with no  murmurs.  Abdomen Inspection Hernias - Inguinal hernia - Right - Reducible(Not very tender. Wearing a truss).    Assessment & Plan [name] MD; 10/28/2015 11:41 AM) RIGHT INGUINAL HERNIA (K40.90) Story: 2-3 weeks noticed. Probably some symptms earlier Impression: Reducible RIH. Likely direct. Will try to get scheduled for repair next week.  Current Plans Pt Education - Hernia: discussed with patient and provided information.  Site of the hernia marked and patient understands risks and benefits of surgery.  Student approved for observation only.  [name], III, MD, FACS [phone]--pager [phone]--office [facility]

## 2015-11-07 NOTE — Anesthesia Procedure Notes (Addendum)
Anesthesia Regional Block:  TAP block  Pre-Anesthetic Checklist: ,, timeout performed, Correct Patient, Correct Site, Correct Laterality, Correct Procedure, Correct Position, site marked, Risks and benefits discussed,  Surgical consent,  Pre-op evaluation,  At surgeon's request and post-op pain management  Laterality: Right and Lower  Prep: chloraprep       Needles:  Injection technique: Single-shot  Needle Type: Echogenic Needle     Needle Length: 9cm 9 cm Needle Gauge: 21 and 21 G    Additional Needles:  Procedures: ultrasound guided (picture in chart) TAP block Narrative:  Start time: 11/07/2015 7:20 AM End time: 11/07/2015 7:25 AM Injection made incrementally with aspirations every 5 mL.  Performed by: Personally  Anesthesiologist: CREWS, DAVID   Procedure Name: LMA Insertion Date/Time: 11/07/2015 7:34 AM Performed by: Bayan Kushnir D Pre-anesthesia Checklist: Patient identified, Emergency Drugs available, Suction available and Patient being monitored Patient Re-evaluated:Patient Re-evaluated prior to inductionOxygen Delivery Method: Circle System Utilized Preoxygenation: Pre-oxygenation with 100% oxygen Intubation Type: IV induction Ventilation: Mask ventilation without difficulty LMA: LMA inserted LMA Size: 4.0 Number of attempts: 1 Airway Equipment and Method: Bite block Placement Confirmation: positive ETCO2 Tube secured with: Tape Dental Injury: Teeth and Oropharynx as per pre-operative assessment       Right TAP block image

## 2015-11-07 NOTE — H&P (Signed)
Assisted Dr. Crews with right, ultrasound guided, transabdominal plane block. Side rails up, monitors on throughout procedure. See vital signs in flow sheet. Tolerated Procedure well. 

## 2015-11-07 NOTE — Transfer of Care (Signed)
Immediate Anesthesia Transfer of Care Note  Patient: Alan Cortez  Procedure(s) Performed: Procedure(s): OPEN HERNIA REPAIR RIGHT INGUINAL ADULT (Right) INSERTION OF MESH (Right)  Patient Location: PACU  Anesthesia Type:GA combined with regional for post-op pain  Level of Consciousness: awake and patient cooperative  Airway & Oxygen Therapy: Patient Spontanous Breathing and Patient connected to face mask oxygen  Post-op Assessment: Report given to RN and Post -op Vital signs reviewed and stable  Post vital signs: Reviewed and stable  Last Vitals:  Filed Vitals:   11/07/15 0724 11/07/15 0725  BP:    Pulse: 92 87  Temp:    Resp: 19 22    Last Pain: There were no vitals filed for this visit.       Complications: No apparent anesthesia complications

## 2015-11-07 NOTE — Discharge Instructions (Addendum)
Open Hernia Repair, Care After Refer to this sheet in the next few weeks. These instructions provide you with information on caring for yourself after your procedure. Your health care provider may also give you more specific instructions. Your treatment has been planned according to current medical practices, but problems sometimes occur. Call your health care provider if you have any problems or questions after your procedure. WHAT TO EXPECT AFTER THE PROCEDURE After your procedure, it is typical to have the following:  Pain in your abdomen, especially along your incision. You will be given pain medicines to control the pain.  Constipation. You may be given a stool softener to help prevent this. HOME CARE INSTRUCTIONS  Only take over-the-counter or prescription medicines as directed by your health care provider.  Keep the incision area dry and clean. You may wash the incision area gently with soap and water 48 hours after surgery. Gently blot or dab the incision area dry. Do not take baths, use swimming pools, or use hot tubs for 10 days or until your health care provider approves.  Change bandages (dressings) as directed by your health care provider.  Continue your normal diet as directed by your health care provider. Eat plenty of fruits and vegetables to help prevent constipation.  Drink enough fluids to keep your urine clear or pale yellow. This also helps prevent constipation.  Do not drive until your health care provider says it is okay.  Do not lift anything heavier than 10 lb (4.5 kg) or play contact sports for 4 weeks or until your health care provider approves.  Follow up with your health care provider as directed. Ask your health care provider when to make an appointment to have your stitches (sutures) or staples removed.  Leave plastic dressing intact SEEK MEDICAL CARE IF:  You have increased bleeding coming from the incision site.  You have blood in your stool.  You  have increasing pain in the incision area.  You see redness or swelling in the incision area.  You have fluid (pus) coming from the incision.  You have a fever.  You notice a bad smell coming from the incision area or dressing. SEEK IMMEDIATE MEDICAL CARE IF:  You develop a rash.  You have chest pain or shortness of breath.  You feel lightheaded or feel faint.   This information is not intended to replace advice given to you by your health care provider. Make sure you discuss any questions you have with your health care provider.   Document Released: 01/08/2005 Document Revised: 07/12/2014 Document Reviewed: 01/31/2013 Elsevier Interactive Patient Education 2016 [employer].   Post Anesthesia Home Care Instructions  Activity: Get plenty of rest for the remainder of the day. A responsible adult should stay with you for 24 hours following the procedure.  For the next 24 hours, DO NOT: -Drive a car -Advertising copywriter -Drink alcoholic beverages -Take any medication unless instructed by your physician -Make any legal decisions or sign important papers.  Meals: Start with liquid foods such as gelatin or soup. Progress to regular foods as tolerated. Avoid greasy, spicy, heavy foods. If nausea and/or vomiting occur, drink only clear liquids until the nausea and/or vomiting subsides. Call your physician if vomiting continues.  Special Instructions/Symptoms: Your throat may feel dry or sore from the anesthesia or the breathing tube placed in your throat during surgery. If this causes discomfort, gargle with warm salt water. The discomfort should disappear within 24 hours.  If you had a scopolamine  patch placed behind your ear for the management of post- operative nausea and/or vomiting:  1. The medication in the patch is effective for 72 hours, after which it should be removed.  Wrap patch in a tissue and discard in the trash. Wash hands thoroughly with soap and water. 2. You may  remove the patch earlier than 72 hours if you experience unpleasant side effects which may include dry mouth, dizziness or visual disturbances. 3. Avoid touching the patch. Wash your hands with soap and water after contact with the patch.

## 2015-11-07 NOTE — Anesthesia Postprocedure Evaluation (Signed)
Anesthesia Post Note  Patient: Lavena BullionJeffery W Greiner  Procedure(s) Performed: Procedure(s) (LRB): OPEN HERNIA REPAIR RIGHT INGUINAL ADULT (Right) INSERTION OF MESH (Right)  Patient location during evaluation: PACU Anesthesia Type: General Level of consciousness: awake and alert Pain management: pain level controlled Vital Signs Assessment: post-procedure vital signs reviewed and stable Respiratory status: spontaneous breathing, nonlabored ventilation, respiratory function stable and patient connected to nasal cannula oxygen Cardiovascular status: blood pressure returned to baseline and stable Postop Assessment: no signs of nausea or vomiting Anesthetic complications: no    Last Vitals:  Filed Vitals:   11/07/15 0935 11/07/15 1000  BP: 138/87 150/90  Pulse:  70  Temp:  36.5 C  Resp:  16    Last Pain:  Filed Vitals:   11/07/15 1007  PainSc: 0-No pain                 Max Nuno L

## 2015-11-07 NOTE — Op Note (Signed)
OPERATIVE REPORT  DATE OF OPERATION: 11/07/2015  PATIENT:  Alan Cortez  60 y.o. male  PRE-OPERATIVE DIAGNOSIS:  Symptomatic right inguinal hernia  POST-OPERATIVE DIAGNOSIS:  Symptomatic right inguinal hernia, primarily indirect  FINDINGS:  Indirect sac with small direct component  PROCEDURE:  Procedure(s): OPEN HERNIA REPAIR RIGHT INGUINAL ADULT INSERTION OF MESH  SURGEON:  Surgeon(s): Jimmye NormanJames Jeziah Kretschmer, MD  ASSISTANT: None  ANESTHESIA:   general  COMPLICATIONS:  None  EBL: <20 ml  BLOOD ADMINISTERED: none  DRAINS: none   SPECIMEN:  No Specimen  COUNTS CORRECT:  YES  PROCEDURE DETAILS: The patient was taken to the operating room and placed on the table in supine position. He received a transabdominal block prior to entering the operative suite. He was placed on the table and then prepped and draped in usual sterile manner.  A proper timeout was performed identifying the patient and the procedure to be performed. The area of the incision was marked with a marking pen. Made a 6-7 cm transverse curvilinear incision at the level of superficial ring and dissected down to the transversus abdominis muscle and the external oblique fascia. We incised the external oblique fascia through the superficial ring exposing the spermatic cord and what appeared to be an indirect hernia sac. We mobilized the spermatic cord at the pubic tubercle and encircled it with a Penrose drain.  We were subsequently able to place the spermatic cord upper work bench and separate out the indirect sac which is on the anterior medial aspect of the cord from the spermatic cord. We ligated the sac at its base using suture ligatures of 0 Ethibond suture. We resected the excess sac.  Once this was done we repaired the floor of the hernia with a small direct component using an oval piece of polypropylene mesh measuring approximately 5 x 3 cm in size. It was attached to the pubic tubercle, the conjoined tendon anterior  medially, and the reflected portion of the inguinal ligament inferiorly laterally. This is down with a 0 Prolene suture. It had been soaked in antibiotic solution prior to being implanted.  We subsequently closed the external oblique fascia using running 3-0 Vicryl suture. Scarpa's fascia was reapproximated using interrupted 3-0 Vicryl suture. The skin was closed using a running subcuticular stitch of 4-0 Monocryl. Dermabond Steri-Strips and Tegaderm used to complete the dressings. All needle counts, sponge counts, and instrument counts were correct.    PATIENT DISPOSITION:  PACU - hemodynamically stable.   Alan Cortez 5/5/20178:43 AM

## 2015-11-07 NOTE — Anesthesia Preprocedure Evaluation (Addendum)
Anesthesia Evaluation  Patient identified by MRN, date of birth, ID band Patient awake    Reviewed: Allergy & Precautions, NPO status , Patient's Chart, lab work & pertinent test results  Airway Mallampati: I  TM Distance: >3 FB Neck ROM: Full    Dental  (+) Teeth Intact, Dental Advisory Given   Pulmonary former smoker,    breath sounds clear to auscultation       Cardiovascular  Rhythm:Regular Rate:Normal     Neuro/Psych    GI/Hepatic   Endo/Other    Renal/GU      Musculoskeletal   Abdominal   Peds  Hematology   Anesthesia Other Findings   Reproductive/Obstetrics                             Anesthesia Physical Anesthesia Plan  ASA: I  Anesthesia Plan: General   Post-op Pain Management: GA combined w/ Regional for post-op pain   Induction: Intravenous  Airway Management Planned: LMA  Additional Equipment:   Intra-op Plan:   Post-operative Plan: Extubation in OR  Informed Consent: I have reviewed the patients History and Physical, chart, labs and discussed the procedure including the risks, benefits and alternatives for the proposed anesthesia with the patient or authorized representative who has indicated his/her understanding and acceptance.   Dental advisory given  Plan Discussed with: CRNA, Anesthesiologist and Surgeon  Anesthesia Plan Comments:         Anesthesia Quick Evaluation  

## 2015-11-10 ENCOUNTER — Encounter (HOSPITAL_BASED_OUTPATIENT_CLINIC_OR_DEPARTMENT_OTHER): Payer: Self-pay | Admitting: General Surgery

## 2016-07-26 DIAGNOSIS — S31839A Unspecified open wound of anus, initial encounter: Secondary | ICD-10-CM | POA: Diagnosis not present

## 2016-11-28 IMAGING — CR DG CHEST 2V
2 series · 2 of 2 positions shown · non-contrast
Comparison: None.

CLINICAL DATA: Preop inguinal hernia surgery

EXAM:
CHEST  2 VIEW

[w chest pa]
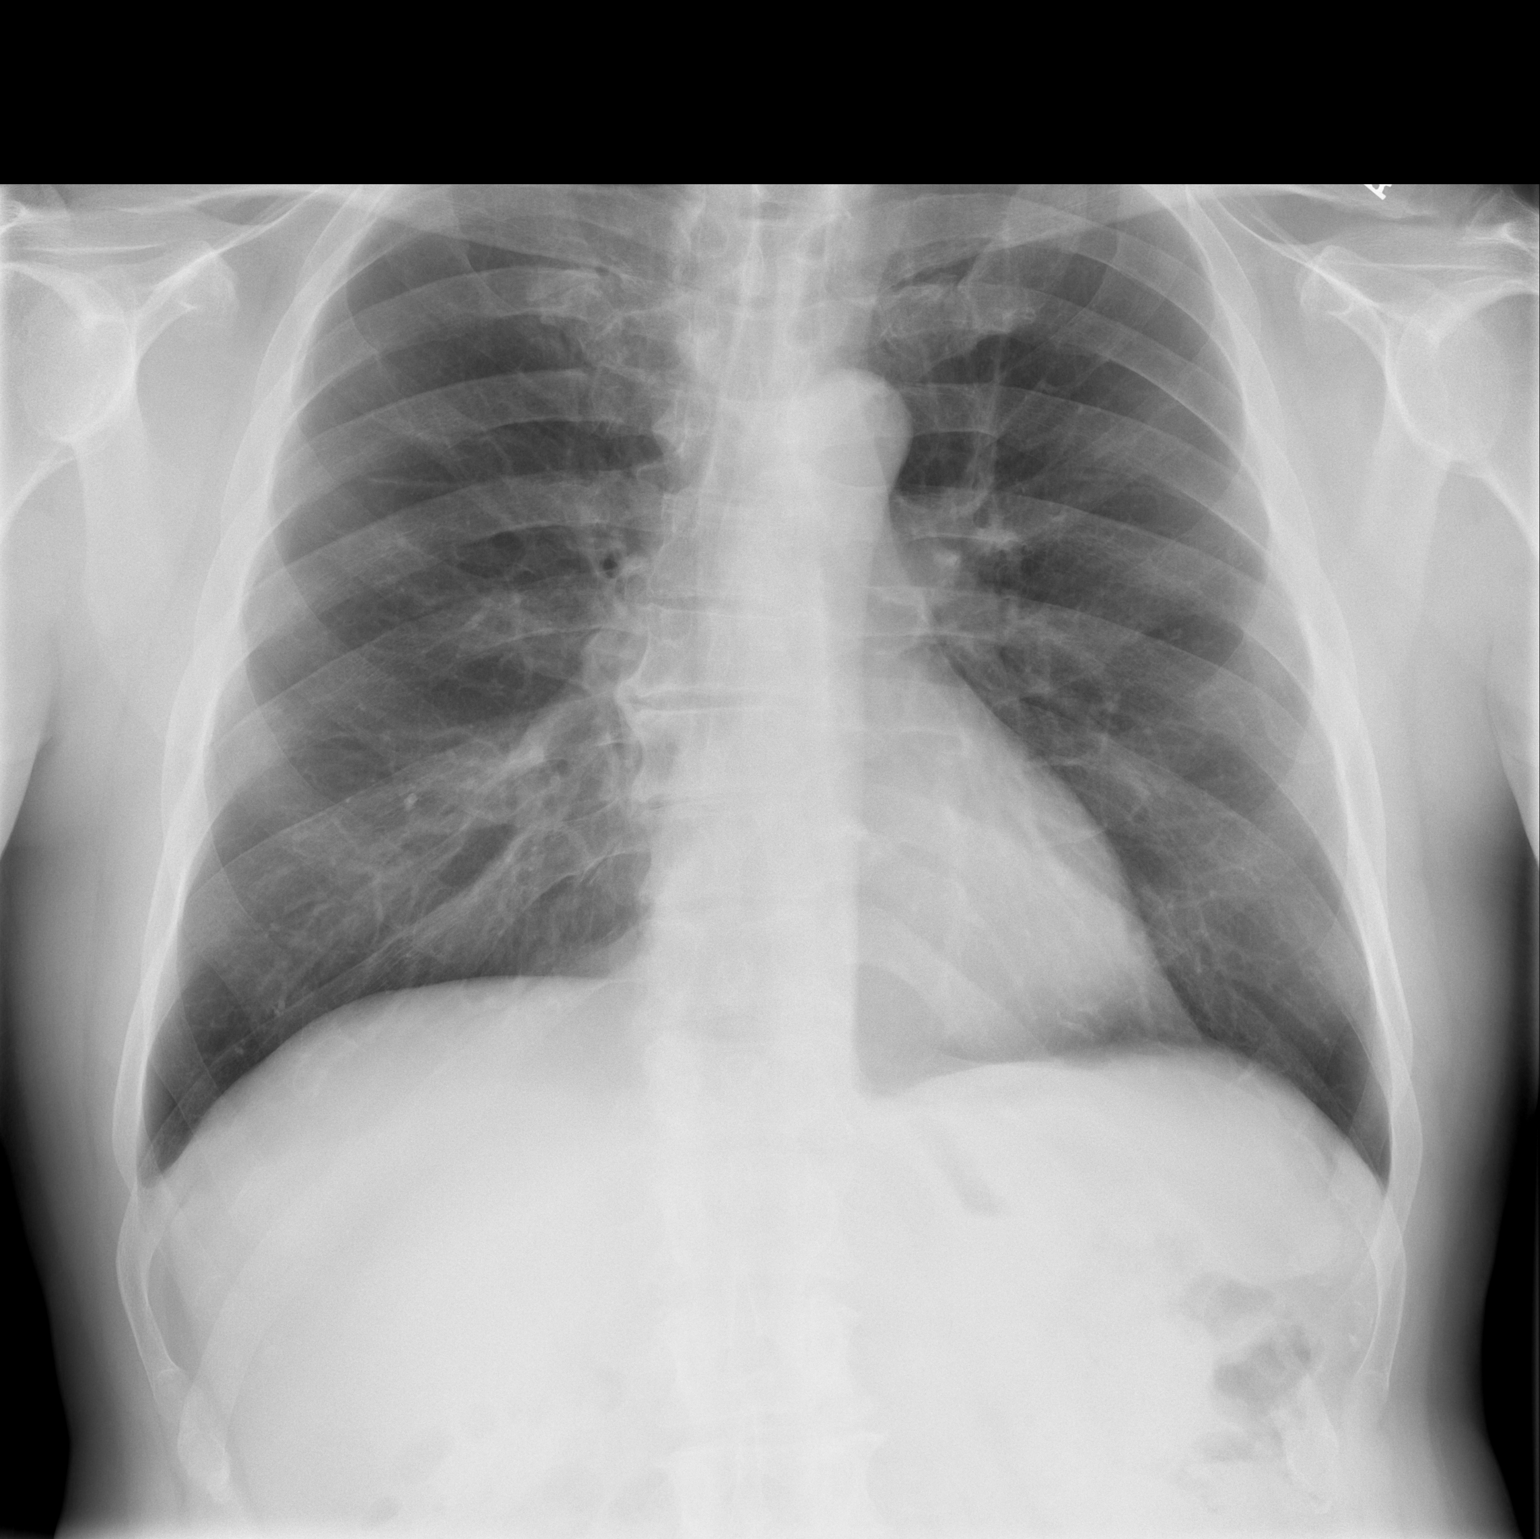

[w chest lat]
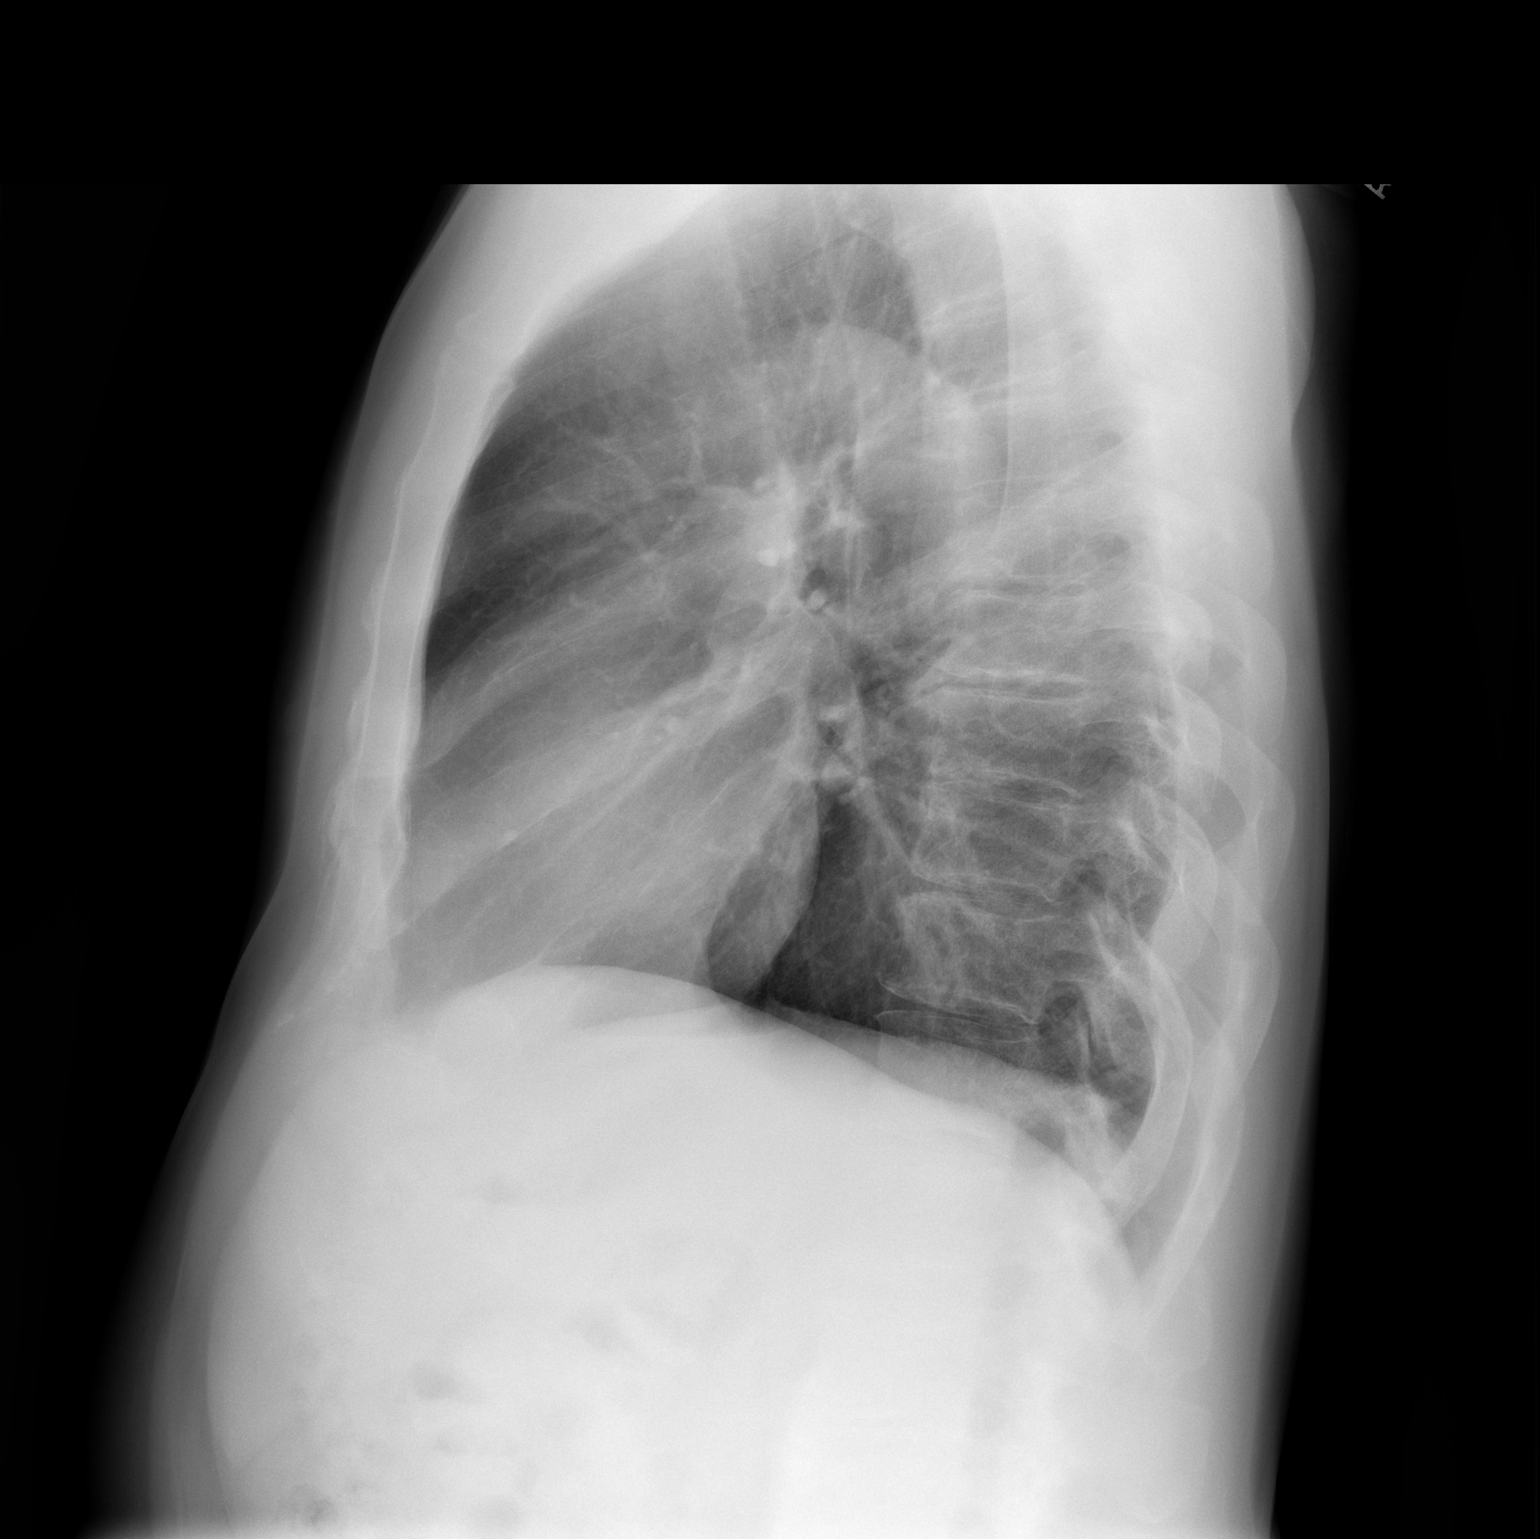

[2 of 2 positions shown; findings below may reference images not displayed]

FINDINGS: The heart size and mediastinal contours are within normal limits.
Both lungs are clear. The visualized skeletal structures are
unremarkable.
IMPRESSION: No active cardiopulmonary disease.

## 2017-03-30 DIAGNOSIS — M2669 Other specified disorders of temporomandibular joint: Secondary | ICD-10-CM | POA: Diagnosis not present

## 2017-04-25 DIAGNOSIS — Z125 Encounter for screening for malignant neoplasm of prostate: Secondary | ICD-10-CM | POA: Diagnosis not present

## 2017-04-25 DIAGNOSIS — E7800 Pure hypercholesterolemia, unspecified: Secondary | ICD-10-CM | POA: Diagnosis not present

## 2017-04-25 DIAGNOSIS — Z0000 Encounter for general adult medical examination without abnormal findings: Secondary | ICD-10-CM | POA: Diagnosis not present

## 2017-04-27 DIAGNOSIS — Z0000 Encounter for general adult medical examination without abnormal findings: Secondary | ICD-10-CM | POA: Diagnosis not present

## 2017-04-27 DIAGNOSIS — Z1212 Encounter for screening for malignant neoplasm of rectum: Secondary | ICD-10-CM | POA: Diagnosis not present

## 2018-04-27 DIAGNOSIS — Z0000 Encounter for general adult medical examination without abnormal findings: Secondary | ICD-10-CM | POA: Diagnosis not present

## 2018-04-27 DIAGNOSIS — Z125 Encounter for screening for malignant neoplasm of prostate: Secondary | ICD-10-CM | POA: Diagnosis not present

## 2018-05-02 DIAGNOSIS — Z0000 Encounter for general adult medical examination without abnormal findings: Secondary | ICD-10-CM | POA: Diagnosis not present

## 2018-05-02 DIAGNOSIS — Z8249 Family history of ischemic heart disease and other diseases of the circulatory system: Secondary | ICD-10-CM | POA: Diagnosis not present

## 2018-05-02 DIAGNOSIS — E7800 Pure hypercholesterolemia, unspecified: Secondary | ICD-10-CM | POA: Diagnosis not present

## 2018-05-03 ENCOUNTER — Other Ambulatory Visit: Payer: Self-pay | Admitting: Internal Medicine

## 2018-05-03 DIAGNOSIS — E7800 Pure hypercholesterolemia, unspecified: Secondary | ICD-10-CM

## 2018-05-03 DIAGNOSIS — Z87891 Personal history of nicotine dependence: Secondary | ICD-10-CM

## 2018-05-16 DIAGNOSIS — M25521 Pain in right elbow: Secondary | ICD-10-CM | POA: Diagnosis not present

## 2018-05-16 DIAGNOSIS — M9907 Segmental and somatic dysfunction of upper extremity: Secondary | ICD-10-CM | POA: Diagnosis not present

## 2018-05-16 DIAGNOSIS — M7711 Lateral epicondylitis, right elbow: Secondary | ICD-10-CM | POA: Diagnosis not present

## 2018-05-18 DIAGNOSIS — M9907 Segmental and somatic dysfunction of upper extremity: Secondary | ICD-10-CM | POA: Diagnosis not present

## 2018-05-18 DIAGNOSIS — M7711 Lateral epicondylitis, right elbow: Secondary | ICD-10-CM | POA: Diagnosis not present

## 2018-05-18 DIAGNOSIS — M25521 Pain in right elbow: Secondary | ICD-10-CM | POA: Diagnosis not present

## 2018-05-22 ENCOUNTER — Other Ambulatory Visit: Payer: Commercial Managed Care - HMO

## 2018-05-22 DIAGNOSIS — M25521 Pain in right elbow: Secondary | ICD-10-CM | POA: Diagnosis not present

## 2018-05-22 DIAGNOSIS — M9907 Segmental and somatic dysfunction of upper extremity: Secondary | ICD-10-CM | POA: Diagnosis not present

## 2018-05-22 DIAGNOSIS — M7711 Lateral epicondylitis, right elbow: Secondary | ICD-10-CM | POA: Diagnosis not present

## 2018-05-25 DIAGNOSIS — M7711 Lateral epicondylitis, right elbow: Secondary | ICD-10-CM | POA: Diagnosis not present

## 2018-05-25 DIAGNOSIS — M25521 Pain in right elbow: Secondary | ICD-10-CM | POA: Diagnosis not present

## 2018-05-25 DIAGNOSIS — M9907 Segmental and somatic dysfunction of upper extremity: Secondary | ICD-10-CM | POA: Diagnosis not present

## 2018-11-01 DIAGNOSIS — Z0000 Encounter for general adult medical examination without abnormal findings: Secondary | ICD-10-CM | POA: Diagnosis not present

## 2018-11-01 DIAGNOSIS — E7800 Pure hypercholesterolemia, unspecified: Secondary | ICD-10-CM | POA: Diagnosis not present

## 2021-09-21 DIAGNOSIS — E789 Disorder of lipoprotein metabolism, unspecified: Secondary | ICD-10-CM | POA: Diagnosis not present

## 2021-09-21 DIAGNOSIS — R7989 Other specified abnormal findings of blood chemistry: Secondary | ICD-10-CM | POA: Diagnosis not present

## 2021-09-21 DIAGNOSIS — E7800 Pure hypercholesterolemia, unspecified: Secondary | ICD-10-CM | POA: Diagnosis not present

## 2021-09-21 DIAGNOSIS — Z0000 Encounter for general adult medical examination without abnormal findings: Secondary | ICD-10-CM | POA: Diagnosis not present

## 2021-10-06 DIAGNOSIS — E7800 Pure hypercholesterolemia, unspecified: Secondary | ICD-10-CM | POA: Diagnosis not present

## 2021-10-06 DIAGNOSIS — Z0000 Encounter for general adult medical examination without abnormal findings: Secondary | ICD-10-CM | POA: Diagnosis not present

## 2021-10-06 DIAGNOSIS — E559 Vitamin D deficiency, unspecified: Secondary | ICD-10-CM | POA: Diagnosis not present

## 2021-10-06 DIAGNOSIS — R7301 Impaired fasting glucose: Secondary | ICD-10-CM | POA: Diagnosis not present

## 2021-12-14 DIAGNOSIS — M7711 Lateral epicondylitis, right elbow: Secondary | ICD-10-CM | POA: Diagnosis not present

## 2021-12-14 DIAGNOSIS — G8929 Other chronic pain: Secondary | ICD-10-CM | POA: Diagnosis not present

## 2021-12-14 DIAGNOSIS — M25561 Pain in right knee: Secondary | ICD-10-CM | POA: Diagnosis not present

## 2021-12-14 DIAGNOSIS — M79631 Pain in right forearm: Secondary | ICD-10-CM | POA: Diagnosis not present

## 2021-12-15 ENCOUNTER — Other Ambulatory Visit: Payer: Self-pay | Admitting: Orthopedic Surgery

## 2021-12-15 DIAGNOSIS — M25561 Pain in right knee: Secondary | ICD-10-CM

## 2022-06-08 DIAGNOSIS — L570 Actinic keratosis: Secondary | ICD-10-CM | POA: Diagnosis not present

## 2022-06-08 DIAGNOSIS — D485 Neoplasm of uncertain behavior of skin: Secondary | ICD-10-CM | POA: Diagnosis not present

## 2022-06-08 DIAGNOSIS — D225 Melanocytic nevi of trunk: Secondary | ICD-10-CM | POA: Diagnosis not present

## 2022-06-08 DIAGNOSIS — D2262 Melanocytic nevi of left upper limb, including shoulder: Secondary | ICD-10-CM | POA: Diagnosis not present

## 2022-06-08 DIAGNOSIS — L821 Other seborrheic keratosis: Secondary | ICD-10-CM | POA: Diagnosis not present

## 2022-06-08 DIAGNOSIS — L814 Other melanin hyperpigmentation: Secondary | ICD-10-CM | POA: Diagnosis not present

## 2022-07-13 DIAGNOSIS — M7711 Lateral epicondylitis, right elbow: Secondary | ICD-10-CM | POA: Diagnosis not present

## 2022-07-13 DIAGNOSIS — M19011 Primary osteoarthritis, right shoulder: Secondary | ICD-10-CM | POA: Diagnosis not present

## 2022-07-13 DIAGNOSIS — M25511 Pain in right shoulder: Secondary | ICD-10-CM | POA: Diagnosis not present

## 2022-10-26 DIAGNOSIS — R7989 Other specified abnormal findings of blood chemistry: Secondary | ICD-10-CM | POA: Diagnosis not present

## 2022-10-26 DIAGNOSIS — E559 Vitamin D deficiency, unspecified: Secondary | ICD-10-CM | POA: Diagnosis not present

## 2022-10-26 DIAGNOSIS — E7800 Pure hypercholesterolemia, unspecified: Secondary | ICD-10-CM | POA: Diagnosis not present

## 2022-10-26 DIAGNOSIS — Z0000 Encounter for general adult medical examination without abnormal findings: Secondary | ICD-10-CM | POA: Diagnosis not present

## 2022-10-26 DIAGNOSIS — Z125 Encounter for screening for malignant neoplasm of prostate: Secondary | ICD-10-CM | POA: Diagnosis not present

## 2022-11-09 DIAGNOSIS — Z0000 Encounter for general adult medical examination without abnormal findings: Secondary | ICD-10-CM | POA: Diagnosis not present

## 2022-11-09 DIAGNOSIS — R0989 Other specified symptoms and signs involving the circulatory and respiratory systems: Secondary | ICD-10-CM | POA: Diagnosis not present

## 2022-11-09 DIAGNOSIS — Z23 Encounter for immunization: Secondary | ICD-10-CM | POA: Diagnosis not present

## 2022-11-09 DIAGNOSIS — E7800 Pure hypercholesterolemia, unspecified: Secondary | ICD-10-CM | POA: Diagnosis not present

## 2022-11-09 DIAGNOSIS — Z823 Family history of stroke: Secondary | ICD-10-CM | POA: Diagnosis not present

## 2022-11-09 DIAGNOSIS — N529 Male erectile dysfunction, unspecified: Secondary | ICD-10-CM | POA: Diagnosis not present

## 2022-11-09 DIAGNOSIS — E559 Vitamin D deficiency, unspecified: Secondary | ICD-10-CM | POA: Diagnosis not present

## 2022-11-17 DIAGNOSIS — R0989 Other specified symptoms and signs involving the circulatory and respiratory systems: Secondary | ICD-10-CM | POA: Diagnosis not present

## 2022-12-27 DIAGNOSIS — M75111 Incomplete rotator cuff tear or rupture of right shoulder, not specified as traumatic: Secondary | ICD-10-CM | POA: Diagnosis not present

## 2023-01-12 DIAGNOSIS — K5730 Diverticulosis of large intestine without perforation or abscess without bleeding: Secondary | ICD-10-CM | POA: Diagnosis not present

## 2023-01-12 DIAGNOSIS — D124 Benign neoplasm of descending colon: Secondary | ICD-10-CM | POA: Diagnosis not present

## 2023-01-12 DIAGNOSIS — Z1211 Encounter for screening for malignant neoplasm of colon: Secondary | ICD-10-CM | POA: Diagnosis not present

## 2023-01-12 DIAGNOSIS — D120 Benign neoplasm of cecum: Secondary | ICD-10-CM | POA: Diagnosis not present

## 2023-01-13 DIAGNOSIS — M75111 Incomplete rotator cuff tear or rupture of right shoulder, not specified as traumatic: Secondary | ICD-10-CM | POA: Diagnosis not present

## 2023-01-13 DIAGNOSIS — M19011 Primary osteoarthritis, right shoulder: Secondary | ICD-10-CM | POA: Diagnosis not present

## 2023-01-13 DIAGNOSIS — M7581 Other shoulder lesions, right shoulder: Secondary | ICD-10-CM | POA: Diagnosis not present

## 2023-01-13 DIAGNOSIS — S46811A Strain of other muscles, fascia and tendons at shoulder and upper arm level, right arm, initial encounter: Secondary | ICD-10-CM | POA: Diagnosis not present

## 2023-01-13 DIAGNOSIS — M25511 Pain in right shoulder: Secondary | ICD-10-CM | POA: Diagnosis not present

## 2023-01-14 DIAGNOSIS — D120 Benign neoplasm of cecum: Secondary | ICD-10-CM | POA: Diagnosis not present

## 2023-01-14 DIAGNOSIS — D124 Benign neoplasm of descending colon: Secondary | ICD-10-CM | POA: Diagnosis not present

## 2023-01-24 DIAGNOSIS — M75121 Complete rotator cuff tear or rupture of right shoulder, not specified as traumatic: Secondary | ICD-10-CM | POA: Diagnosis not present

## 2023-02-17 DIAGNOSIS — Z85828 Personal history of other malignant neoplasm of skin: Secondary | ICD-10-CM | POA: Diagnosis not present

## 2023-02-17 DIAGNOSIS — C44212 Basal cell carcinoma of skin of right ear and external auricular canal: Secondary | ICD-10-CM | POA: Diagnosis not present

## 2023-02-17 DIAGNOSIS — L570 Actinic keratosis: Secondary | ICD-10-CM | POA: Diagnosis not present

## 2023-02-17 DIAGNOSIS — C44329 Squamous cell carcinoma of skin of other parts of face: Secondary | ICD-10-CM | POA: Diagnosis not present

## 2023-02-17 DIAGNOSIS — L821 Other seborrheic keratosis: Secondary | ICD-10-CM | POA: Diagnosis not present

## 2023-03-01 DIAGNOSIS — S46011A Strain of muscle(s) and tendon(s) of the rotator cuff of right shoulder, initial encounter: Secondary | ICD-10-CM | POA: Diagnosis not present

## 2023-03-01 DIAGNOSIS — M25811 Other specified joint disorders, right shoulder: Secondary | ICD-10-CM | POA: Diagnosis not present

## 2023-03-01 DIAGNOSIS — M24111 Other articular cartilage disorders, right shoulder: Secondary | ICD-10-CM | POA: Diagnosis not present

## 2023-03-01 DIAGNOSIS — M7591 Shoulder lesion, unspecified, right shoulder: Secondary | ICD-10-CM | POA: Diagnosis not present

## 2023-03-01 DIAGNOSIS — M75121 Complete rotator cuff tear or rupture of right shoulder, not specified as traumatic: Secondary | ICD-10-CM | POA: Diagnosis not present

## 2023-03-01 DIAGNOSIS — M7581 Other shoulder lesions, right shoulder: Secondary | ICD-10-CM | POA: Diagnosis not present

## 2023-03-01 DIAGNOSIS — G8918 Other acute postprocedural pain: Secondary | ICD-10-CM | POA: Diagnosis not present

## 2023-03-15 DIAGNOSIS — S46011A Strain of muscle(s) and tendon(s) of the rotator cuff of right shoulder, initial encounter: Secondary | ICD-10-CM | POA: Diagnosis not present

## 2023-04-06 DIAGNOSIS — M25511 Pain in right shoulder: Secondary | ICD-10-CM | POA: Diagnosis not present

## 2023-04-06 DIAGNOSIS — R29898 Other symptoms and signs involving the musculoskeletal system: Secondary | ICD-10-CM | POA: Diagnosis not present

## 2023-04-11 DIAGNOSIS — R29898 Other symptoms and signs involving the musculoskeletal system: Secondary | ICD-10-CM | POA: Diagnosis not present

## 2023-04-11 DIAGNOSIS — M25511 Pain in right shoulder: Secondary | ICD-10-CM | POA: Diagnosis not present

## 2023-04-27 DIAGNOSIS — R29898 Other symptoms and signs involving the musculoskeletal system: Secondary | ICD-10-CM | POA: Diagnosis not present

## 2023-04-27 DIAGNOSIS — M25511 Pain in right shoulder: Secondary | ICD-10-CM | POA: Diagnosis not present

## 2023-04-28 DIAGNOSIS — M25511 Pain in right shoulder: Secondary | ICD-10-CM | POA: Diagnosis not present

## 2023-04-28 DIAGNOSIS — R29898 Other symptoms and signs involving the musculoskeletal system: Secondary | ICD-10-CM | POA: Diagnosis not present

## 2023-05-09 DIAGNOSIS — C44212 Basal cell carcinoma of skin of right ear and external auricular canal: Secondary | ICD-10-CM | POA: Diagnosis not present

## 2023-05-11 DIAGNOSIS — S46011A Strain of muscle(s) and tendon(s) of the rotator cuff of right shoulder, initial encounter: Secondary | ICD-10-CM | POA: Diagnosis not present

## 2023-05-11 DIAGNOSIS — R29898 Other symptoms and signs involving the musculoskeletal system: Secondary | ICD-10-CM | POA: Diagnosis not present

## 2023-05-11 DIAGNOSIS — M25511 Pain in right shoulder: Secondary | ICD-10-CM | POA: Diagnosis not present

## 2023-06-01 DIAGNOSIS — M25511 Pain in right shoulder: Secondary | ICD-10-CM | POA: Diagnosis not present

## 2023-06-01 DIAGNOSIS — R29898 Other symptoms and signs involving the musculoskeletal system: Secondary | ICD-10-CM | POA: Diagnosis not present

## 2023-06-09 DIAGNOSIS — L905 Scar conditions and fibrosis of skin: Secondary | ICD-10-CM | POA: Diagnosis not present

## 2023-06-09 DIAGNOSIS — L821 Other seborrheic keratosis: Secondary | ICD-10-CM | POA: Diagnosis not present

## 2023-06-09 DIAGNOSIS — D225 Melanocytic nevi of trunk: Secondary | ICD-10-CM | POA: Diagnosis not present

## 2023-06-15 DIAGNOSIS — M25511 Pain in right shoulder: Secondary | ICD-10-CM | POA: Diagnosis not present

## 2023-06-15 DIAGNOSIS — R29898 Other symptoms and signs involving the musculoskeletal system: Secondary | ICD-10-CM | POA: Diagnosis not present

## 2023-06-20 DIAGNOSIS — M25511 Pain in right shoulder: Secondary | ICD-10-CM | POA: Diagnosis not present

## 2023-06-20 DIAGNOSIS — R29898 Other symptoms and signs involving the musculoskeletal system: Secondary | ICD-10-CM | POA: Diagnosis not present

## 2023-06-21 DIAGNOSIS — D485 Neoplasm of uncertain behavior of skin: Secondary | ICD-10-CM | POA: Diagnosis not present

## 2023-06-21 DIAGNOSIS — D225 Melanocytic nevi of trunk: Secondary | ICD-10-CM | POA: Diagnosis not present

## 2023-06-27 DIAGNOSIS — M25511 Pain in right shoulder: Secondary | ICD-10-CM | POA: Diagnosis not present

## 2023-06-27 DIAGNOSIS — R29898 Other symptoms and signs involving the musculoskeletal system: Secondary | ICD-10-CM | POA: Diagnosis not present

## 2023-07-04 DIAGNOSIS — M25511 Pain in right shoulder: Secondary | ICD-10-CM | POA: Diagnosis not present

## 2023-07-04 DIAGNOSIS — R29898 Other symptoms and signs involving the musculoskeletal system: Secondary | ICD-10-CM | POA: Diagnosis not present

## 2023-07-29 DIAGNOSIS — S46011A Strain of muscle(s) and tendon(s) of the rotator cuff of right shoulder, initial encounter: Secondary | ICD-10-CM | POA: Diagnosis not present

## 2023-08-05 DIAGNOSIS — R002 Palpitations: Secondary | ICD-10-CM | POA: Diagnosis not present

## 2023-08-05 DIAGNOSIS — J302 Other seasonal allergic rhinitis: Secondary | ICD-10-CM | POA: Diagnosis not present

## 2023-08-05 DIAGNOSIS — R42 Dizziness and giddiness: Secondary | ICD-10-CM | POA: Diagnosis not present

## 2023-08-13 ENCOUNTER — Emergency Department (HOSPITAL_BASED_OUTPATIENT_CLINIC_OR_DEPARTMENT_OTHER)
Admission: EM | Admit: 2023-08-13 | Discharge: 2023-08-13 | Disposition: A | Discharge status: Home / Self Care | Payer: Medicare HMO | Attending: Emergency Medicine | Admitting: Emergency Medicine

## 2023-08-13 ENCOUNTER — Emergency Department (HOSPITAL_BASED_OUTPATIENT_CLINIC_OR_DEPARTMENT_OTHER): Payer: Medicare HMO | Admitting: Radiology

## 2023-08-13 ENCOUNTER — Other Ambulatory Visit: Payer: Self-pay

## 2023-08-13 DIAGNOSIS — Z87891 Personal history of nicotine dependence: Secondary | ICD-10-CM | POA: Diagnosis not present

## 2023-08-13 DIAGNOSIS — I700 Atherosclerosis of aorta: Secondary | ICD-10-CM | POA: Diagnosis not present

## 2023-08-13 DIAGNOSIS — R0602 Shortness of breath: Secondary | ICD-10-CM | POA: Diagnosis not present

## 2023-08-13 DIAGNOSIS — R002 Palpitations: Secondary | ICD-10-CM | POA: Diagnosis not present

## 2023-08-13 DIAGNOSIS — R918 Other nonspecific abnormal finding of lung field: Secondary | ICD-10-CM | POA: Diagnosis not present

## 2023-08-13 LAB — TROPONIN I (HIGH SENSITIVITY): Troponin I (High Sensitivity): 4 ng/L (ref <18)

## 2023-08-13 LAB — CBC
HCT: 51.9 % (ref 39.0–52.0)
Hemoglobin: 17 g/dL (ref 13.0–17.0)
MCH: 28.6 pg (ref 26.0–34.0)
MCHC: 32.8 g/dL (ref 30.0–36.0)
MCV: 87.4 fL (ref 80.0–100.0)
Platelets: 223 10*3/uL (ref 150–400)
RBC: 5.94 MIL/uL — ABNORMAL HIGH (ref 4.22–5.81)
RDW: 13.9 % (ref 11.5–15.5)
WBC: 7.6 10*3/uL (ref 4.0–10.5)
nRBC: 0 % (ref 0.0–0.2)

## 2023-08-13 LAB — BASIC METABOLIC PANEL
Anion gap: 9 (ref 5–15)
BUN: 19 mg/dL (ref 8–23)
CO2: 25 mmol/L (ref 22–32)
Calcium: 8.8 mg/dL — ABNORMAL LOW (ref 8.9–10.3)
Chloride: 103 mmol/L (ref 98–111)
Creatinine, Ser: 1.17 mg/dL (ref 0.61–1.24)
GFR, Estimated: >60 mL/min (ref >60)
Glucose, Bld: 115 mg/dL — ABNORMAL HIGH (ref 70–99)
Potassium: 3.6 mmol/L (ref 3.5–5.1)
Sodium: 137 mmol/L (ref 135–145)

## 2023-08-13 LAB — MAGNESIUM: Magnesium: 2.2 mg/dL (ref 1.7–2.4)

## 2023-08-13 NOTE — ED Provider Notes (Signed)
 [facility] Emergency Department Provider Note MRN:  [id number]  Arrival date & time: 08/13/23     Chief Complaint   Palpitations   History of Present Illness   Alan Cortez is a 68 y.o. year-old male with no pertinent past medical history presenting to the ED with chief complaint of palpitations.  Increased frequency of palpitations over the past 30 days.  Heart feels like it skips beats and then does extra beats.  Denies having any chest pain at all.  Occasionally feels dizzy when he stands up, described as a lightheadedness.  Sometimes the extra beats take his breath away.  Denies any episodes of significant shortness of breath, no fever, no cough, no new activities, no new medications.  Review of Systems  A thorough review of systems was obtained and all systems are negative except as noted in the HPI and PMH.   Patient's Health History    Past Medical History:  Diagnosis Date   Medical history non-contributory     Past Surgical History:  Procedure Laterality Date   COLONOSCOPY     INGUINAL HERNIA REPAIR  1992   left   INGUINAL HERNIA REPAIR Right 11/07/2015   Procedure: OPEN HERNIA REPAIR RIGHT INGUINAL ADULT;  Surgeon: Lynwood Pina, MD;  Location: [facility];  Service: General;  Laterality: Right;   INSERTION OF MESH Right 11/07/2015   Procedure: INSERTION OF MESH;  Surgeon: Lynwood Pina, MD;  Location: [facility];  Service: General;  Laterality: Right;   KNEE ARTHROSCOPY Left 08/08/2013   Procedure: ARTHROSCOPY KNEE PARTIAL MEDIAL MENISECTOMY, AND PLICA  RESECTION;  Surgeon: Marcey Raman, MD;  Location:  SURGERY CENTER;  Service: Orthopedics;  Laterality: Left;    No family history on file.  Social History   Socioeconomic History   Marital status: Single    Spouse name: Not on file   Number of children: Not on file   Years of education: Not on file   Highest education level: Not on file  Occupational  History   Not on file  Tobacco Use   Smoking status: Former   Smokeless tobacco: Current    Types: Chew  Substance and Sexual Activity   Alcohol use: Yes    Comment: social   Drug use: No   Sexual activity: Not on file  Other Topics Concern   Not on file  Social History Narrative   Not on file   Social Drivers of Health   Financial Resource Strain: Not on file  Food Insecurity: Not on file  Transportation Needs: Not on file  Physical Activity: Not on file  Stress: Not on file  Social Connections: Unknown (11/17/2021)   Received from [facility], [facility]   Social Network    Social Network: Not on file  Intimate Partner Violence: Unknown (10/09/2021)   Received from [facility], [facility]   HITS    Physically Hurt: Not on file    Insult or Talk Down To: Not on file    Threaten Physical Harm: Not on file    Scream or Curse: Not on file     Physical Exam   Vitals:   08/13/23 0345 08/13/23 0533  BP: (!) 146/83 (!) 140/102  Pulse: 82 75  Resp: 20 20  Temp:    SpO2: 92% 95%    CONSTITUTIONAL: Well-appearing, NAD NEURO/PSYCH:  Alert and oriented x 3, no focal deficits EYES:  eyes equal and reactive ENT/NECK:  no LAD, no  JVD CARDIO: Regular rate, well-perfused, normal S1 and S2 PULM:  CTAB no wheezing or rhonchi GI/GU:  non-distended, non-tender MSK/SPINE:  No gross deformities, no edema SKIN:  no rash, atraumatic   *Additional and/or pertinent findings included in MDM below  Diagnostic and Interventional Summary    EKG Interpretation Date/Time:  Saturday August 13 2023 03:27:34 EST Ventricular Rate:  90 PR Interval:  160 QRS Duration:  96 QT Interval:  350 QTC Calculation: 428 R Axis:   29  Text Interpretation: Normal sinus rhythm Incomplete right bundle branch block Borderline ECG When compared with ECG of 03-Nov-2015 14:10, Premature atrial complexes are no longer Present Vent. rate has increased BY  33 BPM Incomplete right bundle branch  block is now Present Confirmed by Theadore Sharper [phone]) on 08/13/2023 4:14:22 AM       Labs Reviewed  BASIC METABOLIC PANEL - Abnormal; Notable for the following components:      Result Value   Glucose, Bld 115 (*)    Calcium 8.8 (*)    All other components within normal limits  CBC - Abnormal; Notable for the following components:   RBC 5.94 (*)    All other components within normal limits  MAGNESIUM  TROPONIN I (HIGH SENSITIVITY)    DG Chest Port 1 View  Final Result      Medications - No data to display   Procedures  /  Critical Care .1-3 Lead EKG Interpretation  Performed by: Theadore Sharper HERO, MD Authorized by: Theadore Sharper HERO, MD     Interpretation: abnormal     ECG rate:  70s   ECG rate assessment: normal     Rhythm: sinus rhythm     Ectopy: PAC and PVCs     Conduction: normal   Comments:     Cardiac monitoring was ordered to monitor the patient for dysrhythmia.  I personally interpreted the patient's cardiac monitor while at the bedside.     ED Course and Medical Decision Making  Initial Impression and Ddx Patient has longstanding palpitations.  He is actually currently wearing a Zio patch for 2 weeks and has an appointment to follow-up with cardiology to review the findings.  Increased frequency of palpitations but no chest pain, no episodes of shortness of breath, having some likely orthostatic hypotension with this lightheadedness.  Differential diagnosis includes arrhythmia such as A-fib, electrolyte disturbance.  Overall highly doubt ACS, nothing to suggest PE.  Clearly having some PACs and PVCs on cardiac monitoring, likely the explanation of patient's symptoms.  Past medical/surgical history that increases complexity of ED encounter: None  Interpretation of Diagnostics I personally reviewed the EKG and my interpretation is as follows: Sinus rhythm without concerning ischemic findings  Labs reassuring with no significant blood count or electrolyte  disturbance, minimal hypocalcemia  Patient Reassessment and Ultimate Disposition/Management     Discharge  Patient management required discussion with the following services or consulting groups:  None  Complexity of Problems Addressed Acute illness or injury that poses threat of life of bodily function  Additional Data Reviewed and Analyzed Further history obtained from: Further history from spouse/family member  Additional Factors Impacting ED Encounter Risk None  Sharper HERO. Theadore, MD [facility] [email or web address]  Final Clinical Impressions(s) / ED Diagnoses     ICD-10-CM   1. Palpitations  R00.2       ED Discharge Orders     None        Discharge Instructions Discussed with  and Provided to Patient:    Discharge Instructions      You were evaluated in the Emergency Department and after careful evaluation, we did not find any emergent condition requiring admission or further testing in the hospital.  Your exam/testing today is overall reassuring.  Recommend continued follow-up with your cardiologist to discuss her symptoms.  We discussed her slightly low calcium level, can try increasing your dietary calcium or taking a few Tums daily to see if this helps.  Please return to the Emergency Department if you experience any worsening of your condition.   Thank you for allowing us  to be a part of your care.      Theadore Ozell HERO, MD 08/13/23 [phone]

## 2023-08-13 NOTE — ED Notes (Signed)
Patient ambulated to bathroom tolerated well.

## 2023-08-13 NOTE — ED Triage Notes (Signed)
 Pt arrives via POV c/o palpitations that have been ongoing for the past month. Sts concern for increased frequency of palpitations on persistence over the past two hours. Associated with dizziness and intermittent SOB. Is currently on Zio patch by PCP - [facility]. No cardiology. No CP.

## 2023-08-13 NOTE — Discharge Instructions (Signed)
 You were evaluated in the Emergency Department and after careful evaluation, we did not find any emergent condition requiring admission or further testing in the hospital.  Your exam/testing today is overall reassuring.  Recommend continued follow-up with your cardiologist to discuss her symptoms.  We discussed her slightly low calcium level, can try increasing your dietary calcium or taking a few Tums daily to see if this helps.  Please return to the Emergency Department if you experience any worsening of your condition.   Thank you for allowing us  to be a part of your care.

## 2023-08-24 DIAGNOSIS — R002 Palpitations: Secondary | ICD-10-CM | POA: Diagnosis not present

## 2023-08-29 ENCOUNTER — Emergency Department (HOSPITAL_BASED_OUTPATIENT_CLINIC_OR_DEPARTMENT_OTHER)
Admission: EM | Admit: 2023-08-29 | Discharge: 2023-08-29 | Disposition: A | Discharge status: Home / Self Care | Payer: Medicare HMO | Attending: Emergency Medicine | Admitting: Emergency Medicine

## 2023-08-29 ENCOUNTER — Emergency Department (HOSPITAL_BASED_OUTPATIENT_CLINIC_OR_DEPARTMENT_OTHER): Payer: Medicare HMO

## 2023-08-29 ENCOUNTER — Other Ambulatory Visit: Payer: Self-pay

## 2023-08-29 DIAGNOSIS — I491 Atrial premature depolarization: Secondary | ICD-10-CM | POA: Diagnosis not present

## 2023-08-29 DIAGNOSIS — R079 Chest pain, unspecified: Secondary | ICD-10-CM | POA: Diagnosis not present

## 2023-08-29 DIAGNOSIS — R002 Palpitations: Secondary | ICD-10-CM

## 2023-08-29 DIAGNOSIS — R42 Dizziness and giddiness: Secondary | ICD-10-CM | POA: Diagnosis not present

## 2023-08-29 DIAGNOSIS — I493 Ventricular premature depolarization: Secondary | ICD-10-CM | POA: Insufficient documentation

## 2023-08-29 LAB — BASIC METABOLIC PANEL
Anion gap: 9 (ref 5–15)
BUN: 14 mg/dL (ref 8–23)
CO2: 26 mmol/L (ref 22–32)
Calcium: 8.7 mg/dL — ABNORMAL LOW (ref 8.9–10.3)
Chloride: 104 mmol/L (ref 98–111)
Creatinine, Ser: 1.21 mg/dL (ref 0.61–1.24)
GFR, Estimated: >60 mL/min (ref >60)
Glucose, Bld: 120 mg/dL — ABNORMAL HIGH (ref 70–99)
Potassium: 3.6 mmol/L (ref 3.5–5.1)
Sodium: 139 mmol/L (ref 135–145)

## 2023-08-29 LAB — CBC
HCT: 50.8 % (ref 39.0–52.0)
Hemoglobin: 16.4 g/dL (ref 13.0–17.0)
MCH: 28.2 pg (ref 26.0–34.0)
MCHC: 32.3 g/dL (ref 30.0–36.0)
MCV: 87.3 fL (ref 80.0–100.0)
Platelets: 199 10*3/uL (ref 150–400)
RBC: 5.82 MIL/uL — ABNORMAL HIGH (ref 4.22–5.81)
RDW: 13.7 % (ref 11.5–15.5)
WBC: 8 10*3/uL (ref 4.0–10.5)
nRBC: 0 % (ref 0.0–0.2)

## 2023-08-29 LAB — TROPONIN I (HIGH SENSITIVITY): Troponin I (High Sensitivity): 5 ng/L (ref <18)

## 2023-08-29 MED ORDER — METOPROLOL SUCCINATE ER 25 MG PO TB24: 12.5000 mg | ORAL_TABLET | Freq: Every day | ORAL | 0 refills | Status: DC

## 2023-08-29 NOTE — ED Provider Notes (Signed)
 [place] EMERGENCY DEPARTMENT AT [facility] Provider Note   CSN: [id number] Arrival date & time: 08/29/23  4098     History  Chief Complaint  Patient presents with   Chest Pain   Dizziness    Alan Cortez is a 68 y.o. male.  Pt is a 68 yo male with pmhx significant for seasonal allergies and palpitations.  Pt did see his pcp for palpitations and a zio patch was ordered.  This was just turned in a week ago.  He had another episode this am when his heart went fast and felt like it was skipping beats.  He said this did happen when he had on the patch, but he does not have the results.  Pt feels fine now.  Pt did drink coffee before this happened.  He does not use nicotine.       Home Medications Prior to Admission medications   Medication Sig Start Date End Date Taking? Authorizing Provider  metoprolol succinate (TOPROL-XL) 25 MG 24 hr tablet Take 0.5 tablets (12.5 mg total) by mouth daily. 08/29/23  Yes Jacalyn Lefevre, MD  Cetirizine HCl (ZYRTEC ALLERGY) 10 MG CAPS Take 10 mg by mouth.    [provider]  HYDROcodone-acetaminophen (NORCO/VICODIN) 5-325 MG tablet Take 1-2 tablets by mouth every 4 (four) hours as needed for moderate pain or severe pain. 11/07/15   Jimmye Norman, MD  loratadine (CLARITIN) 10 MG tablet Take 10 mg by mouth as needed for allergies.    [provider]      Allergies    Patient has no known allergies.    Review of Systems   Review of Systems  Cardiovascular:  Positive for palpitations.  All other systems reviewed and are negative.   Physical Exam Updated Vital Signs BP (!) 149/91   Pulse 65   Temp 97.9 F (36.6 C) (Oral)   Resp 16   Ht 5\' 8"  (1.727 m)   Wt 89.8 kg   SpO2 95%   BMI 30.10 kg/m  Physical Exam Vitals and nursing note reviewed.  Constitutional:      Appearance: He is well-developed.  HENT:     Head: Normocephalic and atraumatic.  Eyes:     Extraocular Movements: Extraocular movements intact.      Pupils: Pupils are equal, round, and reactive to light.  Cardiovascular:     Rate and Rhythm: Normal rate and regular rhythm.     Heart sounds: Normal heart sounds.     Comments: Occ irreg beat.  PVC and PAC on monitor. Pulmonary:     Effort: Pulmonary effort is normal.     Breath sounds: Normal breath sounds.  Abdominal:     General: Bowel sounds are normal.     Palpations: Abdomen is soft.  Musculoskeletal:        General: Normal range of motion.     Cervical back: Normal range of motion and neck supple.  Skin:    General: Skin is warm.     Capillary Refill: Capillary refill takes less than 2 seconds.  Neurological:     General: No focal deficit present.     Mental Status: He is alert and oriented to person, place, and time.  Psychiatric:        Mood and Affect: Mood normal.        Behavior: Behavior normal.     ED Results / Procedures / Treatments   Labs (all labs ordered are listed, but only abnormal results are displayed)  Labs Reviewed  BASIC METABOLIC PANEL - Abnormal; Notable for the following components:      Result Value   Glucose, Bld 120 (*)    Calcium 8.7 (*)    All other components within normal limits  CBC - Abnormal; Notable for the following components:   RBC 5.82 (*)    All other components within normal limits  TROPONIN I (HIGH SENSITIVITY)    EKG EKG Interpretation Date/Time:  Monday August 29 2023 07:00:22 EST Ventricular Rate:  78 PR Interval:  158 QRS Duration:  103 QT Interval:  387 QTC Calculation: 441 R Axis:   101  Text Interpretation: Sinus rhythm Right axis deviation No significant change since last tracing Confirmed by Jacalyn Lefevre [phone]) on 08/29/2023 7:05:51 AM  Radiology No results found.  Procedures Procedures    Medications Ordered in ED Medications - No data to display  ED Course/ Medical Decision Making/ A&P                                 Medical Decision Making Amount and/or Complexity of Data  Reviewed Labs: ordered. Radiology: ordered.   This patient presents to the ED for concern of palpitations, this involves an extensive number of treatment options, and is a complaint that carries with it a high risk of complications and morbidity.  The differential diagnosis includes svt, afib, st, pvcs   Co morbidities that complicate the patient evaluation  seasonal allergies and palpitations   Additional history obtained:  Additional history obtained from epic chart review External records from outside source obtained and reviewed including wife   Lab Tests:  I Ordered, and personally interpreted labs.  The pertinent results include:  cbc nl, bmp nl, trop nl   Imaging Studies ordered:  I ordered imaging studies including cxr  I independently visualized and interpreted imaging which showed Negative portable chest.  I agree with the radiologist interpretation   Cardiac Monitoring:  The patient was maintained on a cardiac monitor.  I personally viewed and interpreted the cardiac monitored which showed an underlying rhythm of: nsr, PVCs, PACs   Medicines ordered and prescription drug management:   I have reviewed the patients home medicines and have made adjustments as needed  Problem List / ED Course:  Palpitations:  pt is having multiple, intermittent PVCs and PACs on the monitor.  I did call Dr. Carolee Rota office and they don't have the results of his Zio patch.  Pt is willing to start a BB to see if that would help with sx improvement, so I will start him on toprol xl 12.5 mg daily.  He is to also avoid caffeine and nicotine. He knows to return if worse.  F/u with pcp.   Reevaluation:  After the interventions noted above, I reevaluated the patient and found that they have :improved   Social Determinants of Health:  Lives at home   Dispostion:  After consideration of the diagnostic results and the patients response to treatment, I feel that the patent would  benefit from discharge with outpatient f/u.          Final Clinical Impression(s) / ED Diagnoses Final diagnoses:  Palpitations  PVC (premature ventricular contraction)  PAC (premature atrial contraction)    Rx / DC Orders ED Discharge Orders          Ordered    metoprolol succinate (TOPROL-XL) 25 MG 24 hr tablet  Daily  08/29/23 1610              Jacalyn Lefevre, MD 08/29/23 [phone]

## 2023-08-29 NOTE — ED Triage Notes (Signed)
 Cc:dizziness off and on x 3 weeks, with heart palpitations  States just turned in zio patch last week  c/o bil legs feeling week, shaking

## 2023-08-29 NOTE — ED Notes (Signed)
 Pt given discharge instructions and reviewed prescriptions. Opportunities given for questions. Pt verbalizes understanding. PIV removed x1. Jillyn Hidden, RN

## 2023-08-29 NOTE — Discharge Instructions (Addendum)
 Try to avoid caffeine

## 2023-08-29 NOTE — ED Triage Notes (Signed)
 Cc:dizziness off and on x 3 weeks, with heart palpitations  States just turned in zio patch last week  c/o bil legs feeling week, shaking - Valentina Shaggy, RN

## 2023-09-01 DIAGNOSIS — I491 Atrial premature depolarization: Secondary | ICD-10-CM | POA: Diagnosis not present

## 2023-09-01 DIAGNOSIS — R002 Palpitations: Secondary | ICD-10-CM | POA: Diagnosis not present

## 2023-09-01 DIAGNOSIS — I493 Ventricular premature depolarization: Secondary | ICD-10-CM | POA: Diagnosis not present

## 2023-10-25 ENCOUNTER — Encounter: Payer: Self-pay | Admitting: Cardiology

## 2023-10-25 ENCOUNTER — Ambulatory Visit: Attending: Cardiology | Admitting: Cardiology

## 2023-10-25 VITALS — BP 134/80 | HR 57 | Resp 16 | Ht 68.0 in | Wt 189.8 lb

## 2023-10-25 DIAGNOSIS — R002 Palpitations: Secondary | ICD-10-CM | POA: Diagnosis not present

## 2023-10-25 DIAGNOSIS — E7800 Pure hypercholesterolemia, unspecified: Secondary | ICD-10-CM | POA: Diagnosis not present

## 2023-10-25 NOTE — Progress Notes (Signed)
 Cardiology Office Note:  .   Date:  10/25/2023  ID:  Alan, Cortez 1955-12-20, MRN [id number] PCP: Alan Man, MD  [facility] Providers Cardiologist:  Alan Perl, MD   History of Present Illness: .   Alan Cortez is a 68 y.o. Caucasian male patient with no significant prior cardiovascular history except for hypogonadism presently on testosterone supplements, referred to me for evaluation of palpitations.  Discussed the use of AI scribe software for clinical note transcription with the patient, who gave verbal consent to proceed.  History of Present Illness The patient, with a history of premature atrial complexes, presents with a four-month history of palpitations described as a sensation of his heart "skipping beats." The symptoms are more noticeable when the patient is at rest and less so when he is physically active. The patient reports that metoprolol  has significantly reduced his symptoms. He is physically active, playing tennis two to three times a week without experiencing chest pain or shortness of breath. The patient's cholesterol levels are high, but he has no other significant risk factors for heart disease.  Labs   Lab Results  Component Value Date   NA 139 08/29/2023   K 3.6 08/29/2023   CO2 26 08/29/2023   GLUCOSE 120 (H) 08/29/2023   BUN 14 08/29/2023   CREATININE 1.21 08/29/2023   CALCIUM 8.7 (L) 08/29/2023   GFRNONAA >60 08/29/2023      Latest Ref Rng & Units 08/29/2023    7:38 AM 08/13/2023    3:28 AM 11/03/2015    2:30 PM  BMP  Glucose 70 - 99 mg/dL 782  956  99   BUN 8 - 23 mg/dL 14  19  12    Creatinine 0.61 - 1.24 mg/dL 2.13  0.86  5.78   Sodium 135 - 145 mmol/L 139  137  140   Potassium 3.5 - 5.1 mmol/L 3.6  3.6  3.9   Chloride 98 - 111 mmol/L 104  103  105   CO2 22 - 32 mmol/L 26  25  26    Calcium 8.9 - 10.3 mg/dL 8.7  8.8  8.9       Latest Ref Rng & Units 08/29/2023    7:38 AM 08/13/2023    3:28 AM 11/03/2015    2:30 PM  CBC  WBC  4.0 - 10.5 K/uL 8.0  7.6  5.7   Hemoglobin 13.0 - 17.0 g/dL 46.9  62.9  52.8   Hematocrit 39.0 - 52.0 % 50.8  51.9  40.5   Platelets 150 - 400 K/uL 199  223  225    External Labs:  Care everywhere labs   10/27/2022:  TSH normal at 1.37.  Total cholesterol 242, triglycerides 132, HDL 44, LDL 172.  08/27/2020:  Total cholesterol 208, triglycerides 84, HDL 46, LDL 147.  TSH normal at 1.53.  Review of Systems  Cardiovascular:  Positive for palpitations. Negative for chest pain, dyspnea on exertion and leg swelling.  Respiratory:  Positive for snoring (occasional).    Physical Exam:   VS:  BP 134/80 (BP Location: Left Arm, Patient Position: Sitting, Cuff Size: Normal)   Pulse (!) 57   Resp 16   Ht 5\' 8"  (1.727 m)   Wt 189 lb 12.8 oz (86.1 kg)   SpO2 96%   BMI 28.86 kg/m    Wt Readings from Last 3 Encounters:  10/25/23 189 lb 12.8 oz (86.1 kg)  08/29/23 197 lb 15.6 oz (89.8 kg)  08/13/23 198  lb (89.8 kg)    Physical Exam Neck:     Vascular: No carotid bruit or JVD.  Cardiovascular:     Rate and Rhythm: Normal rate and regular rhythm.     Pulses: Intact distal pulses.     Heart sounds: Normal heart sounds. No murmur heard.    No gallop.  Pulmonary:     Effort: Pulmonary effort is normal.     Breath sounds: Normal breath sounds.  Abdominal:     General: Bowel sounds are normal.     Palpations: Abdomen is soft.  Musculoskeletal:     Right lower leg: No edema.     Left lower leg: No edema.    Studies Reviewed: Alan Cortez    Outpatient extended EKG monitoring by Zio patch 14 days starting 08/05/2023 for palpitations personally reviewed: Predominant rhythm is normal sinus rhythm.  Minimum heart rate 48 bpm, maximum heart rate 158 bpm and sinus with average heart rate of 73 bpm. There were 37 SVT episodes, longest 11 beats, EKG = atrial tachycardia.  Frequent isolated PACs noted (6.8%). Isolated ventricular ectopics were rare (<1%). There was no atrial fibrillation, no heart  block.    EKG:    EKG Interpretation Date/Time:  Tuesday October 25 2023 09:44:33 EDT Ventricular Rate:  56 PR Interval:  156 QRS Duration:  92 QT Interval:  402 QTC Calculation: 387 R Axis:   4  Text Interpretation: EKG 10/25/2023: Sinus bradycardia at rate of 56 bpm, normal axis, incomplete right bundle branch block.  Normal EKG.  Compared to 08/29/2023, no significant change. Confirmed by Alan Cortez, Alan Cortez ([id number]) on 10/25/2023 9:59:55 AM    Medications and allergies    No Known Allergies   Current Outpatient Medications:    metoprolol  tartrate (LOPRESSOR ) 25 MG tablet, Take 12.5 mg by mouth 2 (two) times daily., Disp: , Rfl:    testosterone cypionate (DEPOTESTOSTERONE CYPIONATE) 200 MG/ML injection, Inject 100 mg into the muscle every 14 (fourteen) days., Disp: , Rfl:    No orders of the defined types were placed in this encounter.    Medications Discontinued During This Encounter  Medication Reason   HYDROcodone -acetaminophen  (NORCO/VICODIN) 5-325 MG tablet Completed Course   Cetirizine HCl (ZYRTEC ALLERGY) 10 MG CAPS Patient Preference   loratadine (CLARITIN) 10 MG tablet Patient Preference   metoprolol  succinate (TOPROL -XL) 25 MG 24 hr tablet Patient Preference     ASSESSMENT AND PLAN: .      ICD-10-CM   1. Palpitations  R00.2 EKG 12-Lead    CT CARDIAC SCORING (SELF PAY ONLY)    2. Pure hypercholesterolemia  E78.00 CT CARDIAC SCORING (SELF PAY ONLY)      Assessment and Plan Assessment & Plan Premature atrial complexes   Premature atrial complexes are characterized by extra atrial heartbeats, causing palpitations, neck fullness, and discomfort, particularly at rest. These are benign and not indicative of serious cardiac conditions like myocardial infarction. Triggers include stress, sleep deprivation, caffeine, and alcohol.  He was prescribed metoprolol  12.5 mg twice daily and has completely illuminated his symptoms.  Advised him to continue the same and he could  certainly take an extra pill if needed.   If discontinuing metoprolol , taper by taking half a tablet once daily for 2-3 days before stopping.  Hypercholesterolemia   Cholesterol is elevated with LDL at 172 mg/dL, but there are no significant cardiovascular risk factors such as hypertension, tobacco use, family history of premature coronary disease or sudden cardiac death or diabetes. A coronary calcium score is recommended  to assess plaque buildup and determine the need for statin therapy.  A shared decision-making approach is necessary due to reluctance to start medication without evidence of plaque. Order a coronary calcium score and discuss results via MyChart to determine the need for statin therapy based on findings.  I will see him back on a as needed basis.  Signed,  Alan Perl, MD, [facility] 10/25/2023, 5:12 PM [facility] [address] [place], [place] [address] Phone: [phone]. Fax:  [phone]

## 2023-10-25 NOTE — Patient Instructions (Signed)
 Medication Instructions:  Your physician recommends that you continue on your current medications as directed. Please refer to the Current Medication list given to you today.  *If you need a refill on your cardiac medications before your next appointment, please call your pharmacy*  Lab Work: none If you have labs (blood work) drawn today and your tests are completely normal, you will receive your results only by: MyChart Message (if you have MyChart) OR A paper copy in the mail If you have any lab test that is abnormal or we need to change your treatment, we will call you to review the results.  Testing/Procedures: Dr Berry Bristol recommends you have a Calcium Score CT Scan  Follow-Up: At [facility], you and your health needs are our priority.  As part of our continuing mission to provide you with exceptional heart care, our providers are all part of one team.  This team includes your primary Cardiologist (physician) and Advanced Practice Providers or APPs (Physician Assistants and Nurse Practitioners) who all work together to provide you with the care you need, when you need it.  Your next appointment:   As needed  Provider:   Knox Perl, MD     We recommend signing up for the patient portal called "MyChart".  Sign up information is provided on this After Visit Summary.  MyChart is used to connect with patients for Virtual Visits (Telemedicine).  Patients are able to view lab/test results, encounter notes, upcoming appointments, etc.  Non-urgent messages can be sent to your provider as well.   To learn more about what you can do with MyChart, go to [email or web address].   Other Instructions       1st Floor: - Lobby - Registration  - Pharmacy  - Lab - Cafe  2nd Floor: - PV Lab - Diagnostic Testing (echo, CT, nuclear med)  3rd Floor: - Vacant  4th Floor: - TCTS (cardiothoracic surgery) - AFib Clinic - Structural Heart Clinic - Vascular Surgery  - Vascular  Ultrasound  5th Floor: - HeartCare Cardiology (general and EP) - Clinical Pharmacy for coumadin, hypertension, lipid, weight-loss medications, and med management appointments    Valet parking services will be available as well.

## 2023-11-03 ENCOUNTER — Encounter: Payer: Self-pay | Admitting: Cardiology

## 2023-11-03 ENCOUNTER — Ambulatory Visit (HOSPITAL_BASED_OUTPATIENT_CLINIC_OR_DEPARTMENT_OTHER)
Admission: RE | Admit: 2023-11-03 | Discharge: 2023-11-03 | Disposition: A | Discharge status: Home / Self Care | Payer: Self-pay | Source: Ambulatory Visit | Attending: Cardiology | Admitting: Cardiology

## 2023-11-03 DIAGNOSIS — E7800 Pure hypercholesterolemia, unspecified: Secondary | ICD-10-CM | POA: Insufficient documentation

## 2023-11-03 DIAGNOSIS — R002 Palpitations: Secondary | ICD-10-CM | POA: Insufficient documentation

## 2023-11-03 NOTE — Progress Notes (Signed)
 Coronary calcium score 11/03/23  Total Agatston coronary calcium score 6. MESA database percentile 19.  Visualized ascending and descending aorta Normal size. Mild aortic atherosclerosis. Alan Cortez Extracardiac abnormalities: None.

## 2023-11-17 NOTE — Telephone Encounter (Signed)
 Patient has not read my chart message.  Call placed to patient.  Per DPR it is OK to leave a message.  Left message that Dr Berry Bristol has sent him a message through my chart and to call office if any questions.

## 2023-11-22 DIAGNOSIS — Z0000 Encounter for general adult medical examination without abnormal findings: Secondary | ICD-10-CM | POA: Diagnosis not present

## 2023-11-22 DIAGNOSIS — E7800 Pure hypercholesterolemia, unspecified: Secondary | ICD-10-CM | POA: Diagnosis not present

## 2023-11-22 DIAGNOSIS — E559 Vitamin D deficiency, unspecified: Secondary | ICD-10-CM | POA: Diagnosis not present

## 2023-11-22 DIAGNOSIS — R7301 Impaired fasting glucose: Secondary | ICD-10-CM | POA: Diagnosis not present

## 2023-11-22 DIAGNOSIS — Z125 Encounter for screening for malignant neoplasm of prostate: Secondary | ICD-10-CM | POA: Diagnosis not present

## 2023-11-22 LAB — LAB REPORT - SCANNED: EGFR: 70

## 2023-11-25 NOTE — Telephone Encounter (Signed)
 Left message to call office or review results in my chart

## 2023-11-29 DIAGNOSIS — I2510 Atherosclerotic heart disease of native coronary artery without angina pectoris: Secondary | ICD-10-CM | POA: Diagnosis not present

## 2023-11-29 DIAGNOSIS — I493 Ventricular premature depolarization: Secondary | ICD-10-CM | POA: Diagnosis not present

## 2023-11-29 DIAGNOSIS — R972 Elevated prostate specific antigen [PSA]: Secondary | ICD-10-CM | POA: Diagnosis not present

## 2023-11-29 DIAGNOSIS — E559 Vitamin D deficiency, unspecified: Secondary | ICD-10-CM | POA: Diagnosis not present

## 2023-11-29 DIAGNOSIS — I491 Atrial premature depolarization: Secondary | ICD-10-CM | POA: Diagnosis not present

## 2023-11-29 DIAGNOSIS — Z0000 Encounter for general adult medical examination without abnormal findings: Secondary | ICD-10-CM | POA: Diagnosis not present

## 2023-12-02 NOTE — Telephone Encounter (Signed)
 The patient has been notified of the result and verbalized understanding.  All questions (if any) were answered. Alfonso Ike, RN 12/02/2023 4:46 PM

## 2024-06-11 DIAGNOSIS — D225 Melanocytic nevi of trunk: Secondary | ICD-10-CM | POA: Diagnosis not present

## 2024-06-11 DIAGNOSIS — L570 Actinic keratosis: Secondary | ICD-10-CM | POA: Diagnosis not present

## 2024-06-11 DIAGNOSIS — D692 Other nonthrombocytopenic purpura: Secondary | ICD-10-CM | POA: Diagnosis not present

## 2024-06-11 DIAGNOSIS — Z85828 Personal history of other malignant neoplasm of skin: Secondary | ICD-10-CM | POA: Diagnosis not present

## 2024-06-11 DIAGNOSIS — L821 Other seborrheic keratosis: Secondary | ICD-10-CM | POA: Diagnosis not present

## 2024-06-11 DIAGNOSIS — L814 Other melanin hyperpigmentation: Secondary | ICD-10-CM | POA: Diagnosis not present
